# Patient Record
Sex: Female | Born: 2000 | Race: Black or African American | Hispanic: No | Marital: Single | Smoking: Never smoker
Health system: Southern US, Community
[De-identification: ages and names within clinical notes are randomized; demographics above are authoritative.]

## PROBLEM LIST (undated history)

## (undated) DIAGNOSIS — G35 Multiple sclerosis: Secondary | ICD-10-CM

## (undated) DIAGNOSIS — F419 Anxiety disorder, unspecified: Secondary | ICD-10-CM

## (undated) HISTORY — DX: Multiple sclerosis: G35

## (undated) HISTORY — DX: Anxiety disorder, unspecified: F41.9

---

## 2020-12-30 ENCOUNTER — Other Ambulatory Visit (HOSPITAL_COMMUNITY)
Admission: EM | Admit: 2020-12-30 | Discharge: 2021-01-02 | Disposition: A | Discharge status: Home / Self Care | Payer: BC Managed Care – PPO | Attending: Nurse Practitioner | Admitting: Nurse Practitioner

## 2020-12-30 ENCOUNTER — Other Ambulatory Visit: Payer: Self-pay

## 2020-12-30 DIAGNOSIS — Z79899 Other long term (current) drug therapy: Secondary | ICD-10-CM | POA: Diagnosis not present

## 2020-12-30 DIAGNOSIS — F4310 Post-traumatic stress disorder, unspecified: Secondary | ICD-10-CM | POA: Diagnosis not present

## 2020-12-30 DIAGNOSIS — F411 Generalized anxiety disorder: Secondary | ICD-10-CM | POA: Diagnosis not present

## 2020-12-30 DIAGNOSIS — R45851 Suicidal ideations: Secondary | ICD-10-CM | POA: Insufficient documentation

## 2020-12-30 DIAGNOSIS — F332 Major depressive disorder, recurrent severe without psychotic features: Secondary | ICD-10-CM | POA: Diagnosis present

## 2020-12-30 DIAGNOSIS — Z20822 Contact with and (suspected) exposure to covid-19: Secondary | ICD-10-CM | POA: Insufficient documentation

## 2020-12-30 LAB — CBC WITH DIFFERENTIAL/PLATELET
Abs Immature Granulocytes: 0.01 10*3/uL (ref 0.00–0.07)
Basophils Absolute: 0 10*3/uL (ref 0.0–0.1)
Basophils Relative: 1 %
Eosinophils Absolute: 0.4 10*3/uL (ref 0.0–0.5)
Eosinophils Relative: 7 %
HCT: 33.8 % — ABNORMAL LOW (ref 36.0–46.0)
Hemoglobin: 11 g/dL — ABNORMAL LOW (ref 12.0–15.0)
Immature Granulocytes: 0 %
Lymphocytes Relative: 37 %
Lymphs Abs: 2.1 10*3/uL (ref 0.7–4.0)
MCH: 29.1 pg (ref 26.0–34.0)
MCHC: 32.5 g/dL (ref 30.0–36.0)
MCV: 89.4 fL (ref 80.0–100.0)
Monocytes Absolute: 0.5 10*3/uL (ref 0.1–1.0)
Monocytes Relative: 9 %
Neutro Abs: 2.8 10*3/uL (ref 1.7–7.7)
Neutrophils Relative %: 46 %
Platelets: 281 10*3/uL (ref 150–400)
RBC: 3.78 MIL/uL — ABNORMAL LOW (ref 3.87–5.11)
RDW: 12.6 % (ref 11.5–15.5)
WBC: 5.9 10*3/uL (ref 4.0–10.5)
nRBC: 0 % (ref 0.0–0.2)

## 2020-12-30 LAB — LIPID PANEL
Cholesterol: 165 mg/dL (ref 0–200)
HDL: 61 mg/dL (ref >40)
LDL Cholesterol: 96 mg/dL (ref 0–99)
Total CHOL/HDL Ratio: 2.7 RATIO
Triglycerides: 38 mg/dL (ref <150)
VLDL: 8 mg/dL (ref 0–40)

## 2020-12-30 LAB — COMPREHENSIVE METABOLIC PANEL
ALT: 13 U/L (ref 0–44)
AST: 18 U/L (ref 15–41)
Albumin: 4.1 g/dL (ref 3.5–5.0)
Alkaline Phosphatase: 56 U/L (ref 38–126)
Anion gap: 7 (ref 5–15)
BUN: 7 mg/dL (ref 6–20)
CO2: 26 mmol/L (ref 22–32)
Calcium: 9.5 mg/dL (ref 8.9–10.3)
Chloride: 104 mmol/L (ref 98–111)
Creatinine, Ser: 0.72 mg/dL (ref 0.44–1.00)
GFR, Estimated: >60 mL/min (ref >60)
Glucose, Bld: 83 mg/dL (ref 70–99)
Potassium: 3.7 mmol/L (ref 3.5–5.1)
Sodium: 137 mmol/L (ref 135–145)
Total Bilirubin: 0.5 mg/dL (ref 0.3–1.2)
Total Protein: 7.2 g/dL (ref 6.5–8.1)

## 2020-12-30 LAB — POCT URINE DRUG SCREEN - MANUAL ENTRY (I-SCREEN)
POC Amphetamine UR: NOT DETECTED
POC Buprenorphine (BUP): NOT DETECTED
POC Cocaine UR: NOT DETECTED
POC Marijuana UR: POSITIVE — AB
POC Methadone UR: NOT DETECTED
POC Methamphetamine UR: NOT DETECTED
POC Morphine: NOT DETECTED
POC Oxazepam (BZO): NOT DETECTED
POC Oxycodone UR: NOT DETECTED
POC Secobarbital (BAR): NOT DETECTED

## 2020-12-30 LAB — POCT PREGNANCY, URINE: Preg Test, Ur: NEGATIVE

## 2020-12-30 LAB — RESP PANEL BY RT-PCR (FLU A&B, COVID) ARPGX2
Influenza A by PCR: NEGATIVE
Influenza B by PCR: NEGATIVE
SARS Coronavirus 2 by RT PCR: NEGATIVE

## 2020-12-30 LAB — POC SARS CORONAVIRUS 2 AG: SARSCOV2ONAVIRUS 2 AG: NEGATIVE

## 2020-12-30 LAB — HEMOGLOBIN A1C
Hgb A1c MFr Bld: 5.5 % (ref 4.8–5.6)
Mean Plasma Glucose: 111.15 mg/dL

## 2020-12-30 LAB — MAGNESIUM: Magnesium: 1.8 mg/dL (ref 1.7–2.4)

## 2020-12-30 LAB — TSH: TSH: 4.324 u[IU]/mL (ref 0.350–4.500)

## 2020-12-30 LAB — POC SARS CORONAVIRUS 2 AG -  ED: SARS Coronavirus 2 Ag: NEGATIVE

## 2020-12-30 MED ORDER — MAGNESIUM HYDROXIDE 400 MG/5ML PO SUSP: 30.0000 mL | Freq: Every day | ORAL | Status: DC | PRN

## 2020-12-30 MED ORDER — ALUM & MAG HYDROXIDE-SIMETH 200-200-20 MG/5ML PO SUSP: 30.0000 mL | ORAL | Status: DC | PRN

## 2020-12-30 MED ORDER — HYDROXYZINE HCL 25 MG PO TABS: 25.0000 mg | ORAL_TABLET | Freq: Three times a day (TID) | ORAL | Status: DC | PRN

## 2020-12-30 MED ORDER — SERTRALINE HCL 20 MG/ML PO CONC
25.0000 mg | Freq: Every day | ORAL | Status: DC
  Administered 2020-12-30 – 2021-01-02 (×4): 25 mg via ORAL
  Filled 2020-12-30 (×7): qty 1.25

## 2020-12-30 MED ORDER — TRAZODONE HCL 50 MG PO TABS: 50.0000 mg | ORAL_TABLET | Freq: Every evening | ORAL | Status: DC | PRN

## 2020-12-30 MED ORDER — ACETAMINOPHEN 325 MG PO TABS: 650.0000 mg | ORAL_TABLET | Freq: Four times a day (QID) | ORAL | Status: DC | PRN
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
## 2020-12-30 NOTE — ED Notes (Signed)
Given lunch
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
## 2020-12-30 NOTE — Progress Notes (Signed)
Patient visible on unit.  Approached staff with admission folder and asked if there were instructions to use the shower.  Patient already given hygiene supplies and towels.  Asked patient if she needed help with the shower and patient replied that she thought she could do it.  Patient calling mother.

## 2020-12-30 NOTE — Progress Notes (Signed)
Patient sitting in dayroom, playing cards, and socializing with peers. 
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
## 2020-12-31 ENCOUNTER — Encounter (HOSPITAL_COMMUNITY): Payer: Self-pay | Admitting: Nurse Practitioner

## 2020-12-31 NOTE — ED Notes (Signed)
Pt is in room sleeping, respirations are even/unlabored, environment check complete/secure, will continue to monitor patient for safety 

## 2020-12-31 NOTE — ED Notes (Signed)
Pt currently on telephone. No acute distress noted. Safety maintained.

## 2020-12-31 NOTE — ED Notes (Signed)
Patient A&Ox4. Denies intent to harm self/others when asked. Denies A/VH. Patient denies any physical complaints when asked. No acute distress noted. Routine safety checks conducted according to facility protocol. Encouraged patient to notify staff if thoughts of harm toward self or others arise. Patient verbalize understanding and agreement. Will continue to monitor for safety.    

## 2020-12-31 NOTE — Clinical Social Work Psych Note (Signed)
CSW Update   CSW met with patient to assess the patient's progress while being on the unit. According to [facility] she felt "okay" this morning. She reports she did not have any concerns regarding medications prescribed or the programming on the unit.   She continues to report that she will return to her student housing apartment near [employer]. She also ensured CSW that she plans to continue to follow up with the [facility] [address] and will establish services at [facility] to maintain medications.   Swayzie denied having any SI, HI or AVH at this time.   Aritzel shared that a friend was bringing her some extra clothing. CSW stated that would be fine.   Merceded did not have any additional needs or concerns at this time   CSW will continue to follow    Radonna Ricker, MSW, LCSW Clinical Education officer, museum ([place]) [facility]

## 2020-12-31 NOTE — ED Notes (Signed)
PT is in the dining room watching television. 

## 2020-12-31 NOTE — ED Notes (Signed)
Patient alert and verbal. Patient denies SI/HI at this time. Patient has been out in the milieu interacting with peers. Patient given support and encouragement and remains safe on the unit. Monitoring continues.

## 2020-12-31 NOTE — ED Notes (Signed)
Pt actively participating in group. No acute distress noted. Safety maintained. ?

## 2020-12-31 NOTE — Clinical Social Work Psych Note (Signed)
Anxiety  Diagnosis: Anxiety (Psychoeducational)  Date: 12/31/20  Type of Therapy/Therapeutic Modalities: Group Discussion, Psycho-Education  Participation Level: Active  Objective: The purpose of the group is to educate patients on the various components of anxiety and how it can influence and/or contribute to the inappropriate or disproportionate responses to perceived threats, leading to persistent and intrusive symptoms associated with different anxiety disorders.   Therapeutic Goals:  Patient will learn the foundations of anxiety, including its definition and the various types of anxiety disorders.  Patient will discuss with group members their personal experiences with anxiety and how it has impacted their lives  Patient will learn various distress tolerance and relaxation techniques that are effective in treating anxiety symptoms.  Patient will discuss with group members how they plan to address and/or maintain their anxiety symptoms moving forward.   Summary of Patient's Progress:   Elizabeth Tran was engaged and participated throughout the group session. Elizabeth Tran reports that she struggles with panic attacks and stress. She reports her anxiety is closely associated with past traumatic experiences she has had.

## 2020-12-31 NOTE — ED Notes (Signed)
Patient resting in bed with eyes closed. Respirations even and non labored. No distress noted and patient remains safe on unit.

## 2020-12-31 NOTE — ED Provider Notes (Signed)
Behavioral Health Progress Note  Date and Time: 12/31/2020 3:45 PM Name: Elizabeth Tran MRN:  [id number]  Subjective:  Patient seen and chart reviewed. She has been medication compliant with liquid zoloft 25 mg and has been attending groups. Patient observed speaking to LCSW after group; spoke with patient in conjunction with SW. Patient discusses at length relationship conflict with her father, past traumas and how these things have affected her. She describes herself as someone who has "walls" and holds things in, and indicates that this has affected her mood in a negative way and has led to anger. She has tolerated zoloft well. She denies SI/HI/AVH. She does report that she has thoughts of self [facility], although denies that these thoughts have suicidal intent; states that she has self harm thoughts because "I feel like I deserve it".   Diagnosis:  Final diagnoses:  Severe episode of recurrent major depressive disorder, without psychotic features (HCC)  PTSD (post-traumatic stress disorder)  GAD (generalized anxiety disorder)    Total Time spent with patient: 20 minutes  Past Psychiatric History: see H&P Past Medical History: No past medical history on file.  Family History: No family history on file. Family Psychiatric  History: see H&P Social History:  Social History   Substance and Sexual Activity  Alcohol Use None     Social History   Substance and Sexual Activity  Drug Use Not on file    Social History   Socioeconomic History   Marital status: Single    Spouse name: Not on file   Number of children: Not on file   Years of education: Not on file   Highest education level: Not on file  Occupational History   Not on file  Tobacco Use   Smoking status: Never    Passive exposure: Never   Smokeless tobacco: Never  Substance and Sexual Activity   Alcohol use: Not on file   Drug use: Not on file   Sexual activity: Not on file  Other Topics  Concern   Not on file  Social History Narrative   Not on file   Social Determinants of Health   Financial Resource Strain: Not on file  Food Insecurity: Not on file  Transportation Needs: Not on file  Physical Activity: Not on file  Stress: Not on file  Social Connections: Not on file   SDOH:  SDOH Screenings   Alcohol Screen: Not on file  Depression (PHQ2-9): Not on file  Financial Resource Strain: Not on file  Food Insecurity: Not on file  Housing: Not on file  Physical Activity: Not on file  Social Connections: Not on file  Stress: Not on file  Tobacco Use: Low Risk    Smoking Tobacco Use: Never   Smokeless Tobacco Use: Never  Transportation Needs: Not on file   Additional Social History:    Pain Medications: See MRA Prescriptions: See MRA Over the Counter: See MRa History of alcohol / drug use?: Yes Longest period of sobriety (when/how long): Pt reports that she stop taking edible marijuana on December 09, 2020 Negative Consequences of Use: Work / Programmer, [employer] Withdrawal Symptoms:  (UTA) Name of Substance 1: Marijuana (edible) 1 - Age of First Use: UTA 1 - Amount (size/oz): UTA 1 - Frequency: occassiionally 1 - Duration: ongoing 1 - Last Use / Amount: 11/29/2020 1 - Method of Aquiring: UTA 1- Route of Use: eat  Sleep: Fair  Appetite:  Fair  Current Medications:  Current Facility-Administered Medications  Medication Dose Route Frequency Provider Last Rate Last Admin   acetaminophen (TYLENOL) tablet 650 mg  650 mg Oral Q6H PRN Bobbitt, Shalon E, NP       alum & mag hydroxide-simeth (MAALOX/MYLANTA) 200-200-20 MG/5ML suspension 30 mL  30 mL Oral Q4H PRN Bobbitt, Shalon E, NP       hydrOXYzine (ATARAX/VISTARIL) tablet 25 mg  25 mg Oral TID PRN Bobbitt, Shalon E, NP       magnesium hydroxide (MILK OF MAGNESIA) suspension 30 mL  30 mL Oral Daily PRN Bobbitt, Shalon E, NP       sertraline (ZOLOFT) 20 MG/ML concentrated solution 25 mg  25 mg Oral  Daily Estella Husk, MD   25 mg at 12/31/20 1253   traZODone (DESYREL) tablet 50 mg  50 mg Oral QHS PRN Bobbitt, Shalon E, NP       Current Outpatient Medications  Medication Sig Dispense Refill   famotidine (PEPCID) 20 MG tablet Take 20 mg by mouth daily as needed for heartburn or indigestion.      Labs  Lab Results:  Admission on 12/30/2020  Component Date Value Ref Range Status   SARS Coronavirus 2 by RT PCR 12/30/2020 NEGATIVE  NEGATIVE Final   Comment: (NOTE) SARS-CoV-2 target nucleic acids are NOT DETECTED.  The SARS-CoV-2 RNA is generally detectable in upper respiratory specimens during the acute phase of infection. The lowest concentration of SARS-CoV-2 viral copies this assay can detect is 138 copies/mL. A negative result does not preclude SARS-Cov-2 infection and should not be used as the sole basis for treatment or other patient management decisions. A negative result may occur with  improper specimen collection/handling, submission of specimen other than nasopharyngeal swab, presence of viral mutation(s) within the areas targeted by this assay, and inadequate number of viral copies(<138 copies/mL). A negative result must be combined with clinical observations, patient history, and epidemiological information. The expected result is Negative.  Fact Sheet for Patients:  BloggerCourse.com  Fact Sheet for Healthcare Providers:  SeriousBroker.it  This test is no                          t yet approved or cleared by the [place] FDA and  has been authorized for detection and/or diagnosis of SARS-CoV-2 by FDA under an Emergency Use Authorization (EUA). This EUA will remain  in effect (meaning this test can be used) for the duration of the COVID-19 declaration under Section 564(b)(1) of the Act, 21 U.S.C.section 360bbb-3(b)(1), unless the authorization is terminated  or revoked sooner.       Influenza A by  PCR 12/30/2020 NEGATIVE  NEGATIVE Final   Influenza B by PCR 12/30/2020 NEGATIVE  NEGATIVE Final   Comment: (NOTE) The Xpert Xpress SARS-CoV-2/FLU/RSV plus assay is intended as an aid in the diagnosis of influenza from Nasopharyngeal swab specimens and should not be used as a sole basis for treatment. Nasal washings and aspirates are unacceptable for Xpert Xpress SARS-CoV-2/FLU/RSV testing.  Fact Sheet for Patients: BloggerCourse.com  Fact Sheet for Healthcare Providers: SeriousBroker.it  This test is not yet approved or cleared by the [place] FDA and has been authorized for detection and/or diagnosis of SARS-CoV-2 by FDA under an Emergency Use Authorization (EUA). This EUA will remain in effect (meaning this test can be used) for the duration of the COVID-19 declaration under Section 564(b)(1) of the Act,  21 U.S.C. section 360bbb-3(b)(1), unless the authorization is terminated or revoked.  Performed at [facility], [address], [place], [place] [address]    WBC 12/30/2020 5.9  4.0 - 10.5 K/uL Final   RBC 12/30/2020 3.78 (A) 3.87 - 5.11 MIL/uL Final   Hemoglobin 12/30/2020 11.0 (A) 12.0 - 15.0 g/dL Final   HCT 91/47/8295 33.8 (A) 36.0 - 46.0 % Final   MCV 12/30/2020 89.4  80.0 - 100.0 fL Final   MCH 12/30/2020 29.1  26.0 - 34.0 pg Final   MCHC 12/30/2020 32.5  30.0 - 36.0 g/dL Final   RDW 62/13/0865 12.6  11.5 - 15.5 % Final   Platelets 12/30/2020 281  150 - 400 K/uL Final   nRBC 12/30/2020 0.0  0.0 - 0.2 % Final   Neutrophils Relative % 12/30/2020 46  % Final   Neutro Abs 12/30/2020 2.8  1.7 - 7.7 K/uL Final   Lymphocytes Relative 12/30/2020 37  % Final   Lymphs Abs 12/30/2020 2.1  0.7 - 4.0 K/uL Final   Monocytes Relative 12/30/2020 9  % Final   Monocytes Absolute 12/30/2020 0.5  0.1 - 1.0 K/uL Final   Eosinophils Relative 12/30/2020 7  % Final   Eosinophils Absolute 12/30/2020 0.4  0.0 - 0.5 K/uL Final    Basophils Relative 12/30/2020 1  % Final   Basophils Absolute 12/30/2020 0.0  0.0 - 0.1 K/uL Final   Immature Granulocytes 12/30/2020 0  % Final   Abs Immature Granulocytes 12/30/2020 0.01  0.00 - 0.07 K/uL Final   Performed at [facility], [address] [place], [place] [address]   Sodium 12/30/2020 137  135 - 145 mmol/L Final   Potassium 12/30/2020 3.7  3.5 - 5.1 mmol/L Final   Chloride 12/30/2020 104  98 - 111 mmol/L Final   CO2 12/30/2020 26  22 - 32 mmol/L Final   Glucose, Bld 12/30/2020 83  70 - 99 mg/dL Final   Glucose reference range applies only to samples taken after fasting for at least 8 hours.   BUN 12/30/2020 7  6 - 20 mg/dL Final   Creatinine, Ser 12/30/2020 0.72  0.44 - 1.00 mg/dL Final   Calcium 62/95/2841 9.5  8.9 - 10.3 mg/dL Final   Total Protein 32/44/0102 7.2  6.5 - 8.1 g/dL Final   Albumin 72/53/6644 4.1  3.5 - 5.0 g/dL Final   AST 03/47/4259 18  15 - 41 U/L Final   ALT 12/30/2020 13  0 - 44 U/L Final   Alkaline Phosphatase 12/30/2020 56  38 - 126 U/L Final   Total Bilirubin 12/30/2020 0.5  0.3 - 1.2 mg/dL Final   GFR, Estimated 12/30/2020 >60  >60 mL/min Final   Comment: (NOTE) Calculated using the CKD-EPI Creatinine Equation (2021)    Anion gap 12/30/2020 7  5 - 15 Final   Performed at [facility], [address], [place], [place] [address]   Hgb A1c MFr Bld 12/30/2020 5.5  4.8 - 5.6 % Final   Comment: (NOTE) Pre diabetes:          5.7%-6.4%  Diabetes:              >6.4%  Glycemic control for   <7.0% adults with diabetes    Mean Plasma Glucose 12/30/2020 111.15  mg/dL Final   Performed at [facility], [address], [place], [place] [address]   Magnesium 12/30/2020 1.8  1.7 - 2.4 mg/dL Final   Performed  at [facility], [address] [place], [place] [address]   Cholesterol 12/30/2020 165  0 - 200 mg/dL Final   Triglycerides 41/74/0814 38  <150 mg/dL Final   HDL 48/18/5631 61  >40 mg/dL Final   Total CHOL/HDL Ratio  12/30/2020 2.7  RATIO Final   VLDL 12/30/2020 8  0 - 40 mg/dL Final   LDL Cholesterol 12/30/2020 96  0 - 99 mg/dL Final   Comment:        Total Cholesterol/HDL:CHD Risk Coronary Heart Disease Risk Table                     Men   Women  1/2 Average Risk   3.4   3.3  Average Risk       5.0   4.4  2 X Average Risk   9.6   7.1  3 X Average Risk  23.4   11.0        Use the calculated Patient Ratio above and the CHD Risk Table to determine the patient's CHD Risk.        ATP III CLASSIFICATION (LDL):  <100     mg/dL   Optimal  497-026  mg/dL   Near or Above                    Optimal  130-159  mg/dL   Borderline  378-588  mg/dL   High  >502     mg/dL   Very High Performed at [facility], [address] [place], [place] [address]    TSH 12/30/2020 4.324  0.350 - 4.500 uIU/mL Final   Comment: Performed by a 3rd Generation assay with a functional sensitivity of <=0.01 uIU/mL. Performed at [facility], [address], [place], [place] [address]    POC Amphetamine UR 12/30/2020 None Detected  NONE DETECTED (Cut Off Level 1000 ng/mL) Final   POC Secobarbital (BAR) 12/30/2020 None Detected  NONE DETECTED (Cut Off Level 300 ng/mL) Final   POC Buprenorphine (BUP) 12/30/2020 None Detected  NONE DETECTED (Cut Off Level 10 ng/mL) Final   POC Oxazepam (BZO) 12/30/2020 None Detected  NONE DETECTED (Cut Off Level 300 ng/mL) Final   POC Cocaine UR 12/30/2020 None Detected  NONE DETECTED (Cut Off Level 300 ng/mL) Final   POC Methamphetamine UR 12/30/2020 None Detected  NONE DETECTED (Cut Off Level 1000 ng/mL) Final   POC Morphine 12/30/2020 None Detected  NONE DETECTED (Cut Off Level 300 ng/mL) Final   POC Oxycodone UR 12/30/2020 None Detected  NONE DETECTED (Cut Off Level 100 ng/mL) Final   POC Methadone UR 12/30/2020 None Detected  NONE DETECTED (Cut Off Level 300 ng/mL) Final   POC Marijuana UR 12/30/2020 Positive (A) NONE DETECTED (Cut Off Level 50 ng/mL) Final   SARS Coronavirus 2  Ag 12/30/2020 Negative  Negative Preliminary   Preg Test, Ur 12/30/2020 NEGATIVE  NEGATIVE Final   Comment:        THE SENSITIVITY OF THIS METHODOLOGY IS >24 mIU/mL    SARSCOV2ONAVIRUS 2 AG 12/30/2020 NEGATIVE  NEGATIVE Final   Comment: (NOTE) SARS-CoV-2 antigen NOT DETECTED.   Negative results are presumptive.  Negative results do not preclude SARS-CoV-2 infection and should not be used as the sole basis for treatment or other patient management decisions, including infection  control decisions, particularly in the presence of clinical signs and  symptoms consistent with COVID-19, or in those who have been in contact with the  virus.  Negative results must be combined with clinical observations, patient history, and epidemiological information. The expected result is Negative.  Fact Sheet for Patients: https://www.jennings-kim.com/  Fact Sheet for Healthcare Providers: https://alexander-rogers.biz/  This test is not yet approved or cleared by the [place] FDA and  has been authorized for detection and/or diagnosis of SARS-CoV-2 by FDA under an Emergency Use Authorization (EUA).  This EUA will remain in effect (meaning this test can be used) for the duration of  the COV                          ID-19 declaration under Section 564(b)(1) of the Act, 21 U.S.C. section 360bbb-3(b)(1), unless the authorization is terminated or revoked sooner.      Blood Alcohol level:  No results found for: [facility]  Metabolic Disorder Labs: Lab Results  Component Value Date   HGBA1C 5.5 12/30/2020   MPG 111.15 12/30/2020   No results found for: PROLACTIN Lab Results  Component Value Date   CHOL 165 12/30/2020   TRIG 38 12/30/2020   HDL 61 12/30/2020   CHOLHDL 2.7 12/30/2020   VLDL 8 12/30/2020   LDLCALC 96 12/30/2020    Therapeutic Lab Levels: No results found for: LITHIUM No results found for: VALPROATE No components found for:  CBMZ  Physical Findings    Flowsheet Row ED from 12/30/2020 in [facility]  C-SSRS RISK CATEGORY High Risk        Musculoskeletal  Strength & Muscle Tone: within normal limits Gait & Station: normal Patient leans: N/A  Psychiatric Specialty Exam  Presentation  General Appearance: Appropriate for Environment; Casual  Eye Contact:Fair  Speech:Clear and Coherent; Normal Rate  Speech Volume:Normal  Handedness:Right   Mood and Affect  Mood:Angry; Anxious; Depressed  Affect:Depressed; Blunt   Thought Process  Thought Processes:Coherent; Goal Directed; Linear  Descriptions of Associations:Intact  Orientation:Full (Time, Place and Person)  Thought Content:WDL; Logical  Diagnosis of Schizophrenia or Schizoaffective disorder in past: No    Hallucinations:Hallucinations: None Description of Visual Hallucinations: shadow. see above  Ideas of Reference:None  Suicidal Thoughts:Suicidal Thoughts: No (reports thoughts of self harm although denies SI) SI Active Intent and/or Plan: With Intent; With Plan  Homicidal Thoughts:Homicidal Thoughts: No   Sensorium  Memory:Immediate Good; Recent Good; Remote Good  Judgment:Fair  Insight:Fair   Executive Functions  Concentration:Good  Attention Span:Good  Recall:Good  Fund of Knowledge:Good  Language:Good   Psychomotor Activity  Psychomotor Activity:Psychomotor Activity: Normal   Assets  Assets:Communication Skills; Desire for Improvement; Financial Resources/Insurance; Housing; Leisure Time; Physical Health; Resilience; Social Support; Vocational/Educational   Sleep  Sleep:Sleep: Fair Number of Hours of Sleep: -1   Nutritional Assessment (For OBS and FBC admissions only) Has the patient had a weight loss or gain of 10 pounds or more in the last 3 months?: Yes Has the patient had a decrease in food intake/or appetite?: Yes Does the patient have dental problems?: No Does the patient have eating habits  or behaviors that may be indicators of an eating disorder including binging or inducing vomiting?: No Has the patient recently lost weight without trying?: Yes, 2-13 lbs. Has the patient been eating poorly because of a decreased appetite?: No Malnutrition Screening Tool Score: 1    Physical Exam  Physical Exam Constitutional:      Appearance: Normal appearance. She is normal weight.  HENT:     Head: Normocephalic and atraumatic.  Eyes:  Comments: Dry skin bilaterally above eye lids  Pulmonary:     Effort: Pulmonary effort is normal.  Neurological:     Mental Status: She is alert and oriented to person, place, and time.   Review of Systems  Constitutional:  Negative for chills and fever.  HENT:  Negative for hearing loss.   Eyes:  Negative for discharge and redness.  Respiratory:  Negative for cough.   Cardiovascular:  Negative for chest pain.  Gastrointestinal:  Negative for abdominal pain.  Musculoskeletal:  Negative for myalgias.  Neurological:  Negative for headaches.  Psychiatric/Behavioral:  Positive for depression. Negative for suicidal ideas.   Blood pressure 113/89, pulse 60, temperature 98.4 F (36.9 C), temperature source Oral, resp. rate 20, SpO2 100 %. There is no height or weight on file to calculate BMI.  Treatment Plan Summary: 20 yo female who presented to the [facility] with SI with a plan to cut her wrist via mobile crisis unit. Patient was agreeable to admission to [facility] for further treatment.  Labs reviewed- CBC, lipids, a1c, TSH, unremarkable. UDS+THC, pregnancy test neg, CBC with mildly decreased hemoglobin at 11, covid negative. Patient agreeable to start zoloft 25 mg for mood and has tolerated it well. Patientc continues to report low mood and thoughts of self harm; although denies SI, plan or intent. She is  appropriate for continued treatment at the [facility] at this time.     MDD -continue zoloft 25 mg daily liquid forulation  Dispo: ongoing. SW assisting with  dispo  Estella Husk, MD 12/31/2020 3:45 PM

## 2020-12-31 NOTE — ED Notes (Signed)
Patient is resting in bed with eyes closed. Respirations are even and non labored. Monitoring continues and patient remains safe on unit.

## 2021-01-01 LAB — PROLACTIN: Prolactin: 17.7 ng/mL (ref 4.8–23.3)

## 2021-01-01 NOTE — ED Provider Notes (Signed)
Behavioral Health Progress Note  Date and Time: 01/01/2021 3:53 PM Name: Elizabeth Tran MRN:  [id number]  Subjective:   Original note by student Doctor Rosana Hoes; edited by me as appropriate for completeness and accuracy.    Patient seen and chart reviewed. Patient interviewed in conjuction with medical students. She reports improving mood with the main stressors being her father, ex-stepfather, and difficulties with school. She notes that she has been reflecting on yesterday's conversation with myself and LCSW and working on setting boundaries with the main stressors that she has been experiencing. She discussed that she understands that setting boundaries will be difficult, but notes it is something she wants to do as she "wants to feel better". She noted deleting her playlist of sad music, and blocking some contacts on her phone that she feels are not reciprocating the effort that she puts into the relationship. She notes that the group sessions have been helpful. She denies SI today, but notes HI towards ex-stepfather, whom she is currently not in contact with. There is no intent or plan associated with vague HI; she does not know how to get in contact with him-there is no credible threat.  She has been tolerating zoloft well, noting mild headaches.        Diagnosis:  Final diagnoses:  Severe episode of recurrent major depressive disorder, without psychotic features (HCC)  PTSD (post-traumatic stress disorder)  GAD (generalized anxiety disorder)    Total Time spent with patient: 20 minutes  Past Psychiatric History: see H&P Past Medical History: No past medical history on file.  Family History: No family history on file. Family Psychiatric  History: see H&P Social History:  Social History   Substance and Sexual Activity  Alcohol Use None     Social History   Substance and Sexual Activity  Drug Use Not on file    Social History   Socioeconomic History   Marital status: Single     Spouse name: Not on file   Number of children: Not on file   Years of education: Not on file   Highest education level: Not on file  Occupational History   Not on file  Tobacco Use   Smoking status: Never    Passive exposure: Never   Smokeless tobacco: Never  Substance and Sexual Activity   Alcohol use: Not on file   Drug use: Not on file   Sexual activity: Not on file  Other Topics Concern   Not on file  Social History Narrative   Not on file   Social Determinants of Health   Financial Resource Strain: Not on file  Food Insecurity: Not on file  Transportation Needs: Not on file  Physical Activity: Not on file  Stress: Not on file  Social Connections: Not on file   SDOH:  SDOH Screenings   Alcohol Screen: Not on file  Depression (PHQ2-9): Not on file  Financial Resource Strain: Not on file  Food Insecurity: Not on file  Housing: Not on file  Physical Activity: Not on file  Social Connections: Not on file  Stress: Not on file  Tobacco Use: Low Risk    Smoking Tobacco Use: Never   Smokeless Tobacco Use: Never  Transportation Needs: Not on file   Additional Social History:    Pain Medications: See MRA Prescriptions: See MRA Over the Counter: See MRa History of alcohol / drug use?: Yes Longest period of sobriety (when/how long): Pt reports that she stop taking edible marijuana on December 09, 2020  Negative Consequences of Use: Work / School Withdrawal Symptoms:  (UTA) Name of Substance 1: Marijuana (edible) 1 - Age of First Use: UTA 1 - Amount (size/oz): UTA 1 - Frequency: occassiionally 1 - Duration: ongoing 1 - Last Use / Amount: 11/29/2020 1 - Method of Aquiring: UTA 1- Route of Use: eat                  Sleep: Fair  Appetite:  Fair  Current Medications:  Current Facility-Administered Medications  Medication Dose Route Frequency Provider Last Rate Last Admin   acetaminophen (TYLENOL) tablet 650 mg  650 mg Oral Q6H PRN Bobbitt, Shalon E, NP        alum & mag hydroxide-simeth (MAALOX/MYLANTA) 200-200-20 MG/5ML suspension 30 mL  30 mL Oral Q4H PRN Bobbitt, Shalon E, NP       hydrOXYzine (ATARAX/VISTARIL) tablet 25 mg  25 mg Oral TID PRN Bobbitt, Shalon E, NP       magnesium hydroxide (MILK OF MAGNESIA) suspension 30 mL  30 mL Oral Daily PRN Bobbitt, Shalon E, NP       sertraline (ZOLOFT) 20 MG/ML concentrated solution 25 mg  25 mg Oral Daily Estella Husk, MD   25 mg at 01/01/21 3825   traZODone (DESYREL) tablet 50 mg  50 mg Oral QHS PRN Bobbitt, Shalon E, NP       Current Outpatient Medications  Medication Sig Dispense Refill   famotidine (PEPCID) 20 MG tablet Take 20 mg by mouth daily as needed for heartburn or indigestion.      Labs  Lab Results:  Admission on 12/30/2020  Component Date Value Ref Range Status   SARS Coronavirus 2 by RT PCR 12/30/2020 NEGATIVE  NEGATIVE Final   Comment: (NOTE) SARS-CoV-2 target nucleic acids are NOT DETECTED.  The SARS-CoV-2 RNA is generally detectable in upper respiratory specimens during the acute phase of infection. The lowest concentration of SARS-CoV-2 viral copies this assay can detect is 138 copies/mL. A negative result does not preclude SARS-Cov-2 infection and should not be used as the sole basis for treatment or other patient management decisions. A negative result may occur with  improper specimen collection/handling, submission of specimen other than nasopharyngeal swab, presence of viral mutation(s) within the areas targeted by this assay, and inadequate number of viral copies(<138 copies/mL). A negative result must be combined with clinical observations, patient history, and epidemiological information. The expected result is Negative.  Fact Sheet for Patients:  BloggerCourse.com  Fact Sheet for Healthcare Providers:  SeriousBroker.it  This test is no                          t yet approved or cleared by the  [place] FDA and  has been authorized for detection and/or diagnosis of SARS-CoV-2 by FDA under an Emergency Use Authorization (EUA). This EUA will remain  in effect (meaning this test can be used) for the duration of the COVID-19 declaration under Section 564(b)(1) of the Act, 21 U.S.C.section 360bbb-3(b)(1), unless the authorization is terminated  or revoked sooner.       Influenza A by PCR 12/30/2020 NEGATIVE  NEGATIVE Final   Influenza B by PCR 12/30/2020 NEGATIVE  NEGATIVE Final   Comment: (NOTE) The Xpert Xpress SARS-CoV-2/FLU/RSV plus assay is intended as an aid in the diagnosis of influenza from Nasopharyngeal swab specimens and should not be used as a sole basis for treatment. Nasal washings and aspirates are unacceptable for Xpert Xpress  SARS-CoV-2/FLU/RSV testing.  Fact Sheet for Patients: BloggerCourse.com  Fact Sheet for Healthcare Providers: SeriousBroker.it  This test is not yet approved or cleared by the [place] FDA and has been authorized for detection and/or diagnosis of SARS-CoV-2 by FDA under an Emergency Use Authorization (EUA). This EUA will remain in effect (meaning this test can be used) for the duration of the COVID-19 declaration under Section 564(b)(1) of the Act, 21 U.S.C. section 360bbb-3(b)(1), unless the authorization is terminated or revoked.  Performed at [facility], [address], [place], [place] [address]    WBC 12/30/2020 5.9  4.0 - 10.5 K/uL Final   RBC 12/30/2020 3.78 (A) 3.87 - 5.11 MIL/uL Final   Hemoglobin 12/30/2020 11.0 (A) 12.0 - 15.0 g/dL Final   HCT 86/57/8469 33.8 (A) 36.0 - 46.0 % Final   MCV 12/30/2020 89.4  80.0 - 100.0 fL Final   MCH 12/30/2020 29.1  26.0 - 34.0 pg Final   MCHC 12/30/2020 32.5  30.0 - 36.0 g/dL Final   RDW 62/95/2841 12.6  11.5 - 15.5 % Final   Platelets 12/30/2020 281  150 - 400 K/uL Final   nRBC 12/30/2020 0.0  0.0 - 0.2 % Final    Neutrophils Relative % 12/30/2020 46  % Final   Neutro Abs 12/30/2020 2.8  1.7 - 7.7 K/uL Final   Lymphocytes Relative 12/30/2020 37  % Final   Lymphs Abs 12/30/2020 2.1  0.7 - 4.0 K/uL Final   Monocytes Relative 12/30/2020 9  % Final   Monocytes Absolute 12/30/2020 0.5  0.1 - 1.0 K/uL Final   Eosinophils Relative 12/30/2020 7  % Final   Eosinophils Absolute 12/30/2020 0.4  0.0 - 0.5 K/uL Final   Basophils Relative 12/30/2020 1  % Final   Basophils Absolute 12/30/2020 0.0  0.0 - 0.1 K/uL Final   Immature Granulocytes 12/30/2020 0  % Final   Abs Immature Granulocytes 12/30/2020 0.01  0.00 - 0.07 K/uL Final   Performed at [facility], [address], [place], [place] [address]   Sodium 12/30/2020 137  135 - 145 mmol/L Final   Potassium 12/30/2020 3.7  3.5 - 5.1 mmol/L Final   Chloride 12/30/2020 104  98 - 111 mmol/L Final   CO2 12/30/2020 26  22 - 32 mmol/L Final   Glucose, Bld 12/30/2020 83  70 - 99 mg/dL Final   Glucose reference range applies only to samples taken after fasting for at least 8 hours.   BUN 12/30/2020 7  6 - 20 mg/dL Final   Creatinine, Ser 12/30/2020 0.72  0.44 - 1.00 mg/dL Final   Calcium 02/21/2535 9.5  8.9 - 10.3 mg/dL Final   Total Protein 64/40/3474 7.2  6.5 - 8.1 g/dL Final   Albumin 25/95/6387 4.1  3.5 - 5.0 g/dL Final   AST 56/43/3295 18  15 - 41 U/L Final   ALT 12/30/2020 13  0 - 44 U/L Final   Alkaline Phosphatase 12/30/2020 56  38 - 126 U/L Final   Total Bilirubin 12/30/2020 0.5  0.3 - 1.2 mg/dL Final   GFR, Estimated 12/30/2020 >60  >60 mL/min Final   Comment: (NOTE) Calculated using the CKD-EPI Creatinine Equation (2021)    Anion gap 12/30/2020 7  5 - 15 Final   Performed at [facility], [address] [place], [place] [address]   Hgb A1c MFr Bld 12/30/2020 5.5  4.8 - 5.6 % Final   Comment: (NOTE) Pre diabetes:  5.7%-6.4%  Diabetes:              >6.4%  Glycemic control for   <7.0% adults with diabetes    Mean Plasma  Glucose 12/30/2020 111.15  mg/dL Final   Performed at [facility], [address] [place], [place] [address]   Magnesium 12/30/2020 1.8  1.7 - 2.4 mg/dL Final   Performed at [facility], [address], [place], [place] [address]   Cholesterol 12/30/2020 165  0 - 200 mg/dL Final   Triglycerides 82/95/6213 38  <150 mg/dL Final   HDL 08/65/7846 61  >40 mg/dL Final   Total CHOL/HDL Ratio 12/30/2020 2.7  RATIO Final   VLDL 12/30/2020 8  0 - 40 mg/dL Final   LDL Cholesterol 12/30/2020 96  0 - 99 mg/dL Final   Comment:        Total Cholesterol/HDL:CHD Risk Coronary Heart Disease Risk Table                     Men   Women  1/2 Average Risk   3.4   3.3  Average Risk       5.0   4.4  2 X Average Risk   9.6   7.1  3 X Average Risk  23.4   11.0        Use the calculated Patient Ratio above and the CHD Risk Table to determine the patient's CHD Risk.        ATP III CLASSIFICATION (LDL):  <100     mg/dL   Optimal  962-952  mg/dL   Near or Above                    Optimal  130-159  mg/dL   Borderline  841-324  mg/dL   High  >401     mg/dL   Very High Performed at [facility], [address], [place], [place] [address]    TSH 12/30/2020 4.324  0.350 - 4.500 uIU/mL Final   Comment: Performed by a 3rd Generation assay with a functional sensitivity of <=0.01 uIU/mL. Performed at [facility], [address], [place], [place] [address]    POC Amphetamine UR 12/30/2020 None Detected  NONE DETECTED (Cut Off Level 1000 ng/mL) Final   POC Secobarbital (BAR) 12/30/2020 None Detected  NONE DETECTED (Cut Off Level 300 ng/mL) Final   POC Buprenorphine (BUP) 12/30/2020 None Detected  NONE DETECTED (Cut Off Level 10 ng/mL) Final   POC Oxazepam (BZO) 12/30/2020 None Detected  NONE DETECTED (Cut Off Level 300 ng/mL) Final   POC Cocaine UR 12/30/2020 None Detected  NONE DETECTED (Cut Off Level 300 ng/mL) Final   POC Methamphetamine UR 12/30/2020 None Detected  NONE DETECTED (Cut  Off Level 1000 ng/mL) Final   POC Morphine 12/30/2020 None Detected  NONE DETECTED (Cut Off Level 300 ng/mL) Final   POC Oxycodone UR 12/30/2020 None Detected  NONE DETECTED (Cut Off Level 100 ng/mL) Final   POC Methadone UR 12/30/2020 None Detected  NONE DETECTED (Cut Off Level 300 ng/mL) Final   POC Marijuana UR 12/30/2020 Positive (A) NONE DETECTED (Cut Off Level 50 ng/mL) Final   SARS Coronavirus 2 Ag 12/30/2020 Negative  Negative Preliminary   Preg Test, Ur 12/30/2020 NEGATIVE  NEGATIVE Final   Comment:        THE SENSITIVITY OF THIS METHODOLOGY IS >24 mIU/mL    SARSCOV2ONAVIRUS 2 AG 12/30/2020 NEGATIVE  NEGATIVE  Final   Comment: (NOTE) SARS-CoV-2 antigen NOT DETECTED.   Negative results are presumptive.  Negative results do not preclude SARS-CoV-2 infection and should not be used as the sole basis for treatment or other patient management decisions, including infection  control decisions, particularly in the presence of clinical signs and  symptoms consistent with COVID-19, or in those who have been in contact with the virus.  Negative results must be combined with clinical observations, patient history, and epidemiological information. The expected result is Negative.  Fact Sheet for Patients: https://www.jennings-kim.com/  Fact Sheet for Healthcare Providers: https://alexander-rogers.biz/  This test is not yet approved or cleared by the [place] FDA and  has been authorized for detection and/or diagnosis of SARS-CoV-2 by FDA under an Emergency Use Authorization (EUA).  This EUA will remain in effect (meaning this test can be used) for the duration of  the COV                          ID-19 declaration under Section 564(b)(1) of the Act, 21 U.S.C. section 360bbb-3(b)(1), unless the authorization is terminated or revoked sooner.      Blood Alcohol level:  No results found for: [facility]  Metabolic Disorder Labs: Lab Results  Component Value  Date   HGBA1C 5.5 12/30/2020   MPG 111.15 12/30/2020   No results found for: PROLACTIN Lab Results  Component Value Date   CHOL 165 12/30/2020   TRIG 38 12/30/2020   HDL 61 12/30/2020   CHOLHDL 2.7 12/30/2020   VLDL 8 12/30/2020   LDLCALC 96 12/30/2020    Therapeutic Lab Levels: No results found for: LITHIUM No results found for: VALPROATE No components found for:  CBMZ  Physical Findings   Flowsheet Row ED [facility] 12/30/2020 in [facility]  C-SSRS RISK CATEGORY High Risk        Musculoskeletal  Strength & Muscle Tone: within normal limits Gait & Station: normal Patient leans: N/A  Psychiatric Specialty Exam  Presentation  General Appearance: Casual; Appropriate for Environment  Eye Contact:Fleeting  Speech:Blocked; Clear and Coherent  Speech Volume:Normal  Handedness:Right   Mood and Affect  Mood:Dysphoric  Affect:Congruent; Other (comment) (generally blunted but appropriately reactive)   Thought Process  Thought Processes:Coherent; Goal Directed; Linear  Descriptions of Associations:Intact  Orientation:Full (Time, Place and Person)  Thought Content:WDL; Logical  Diagnosis of Schizophrenia or Schizoaffective disorder in past: No    Hallucinations:Hallucinations: None  Ideas of Reference:None  Suicidal Thoughts:Suicidal Thoughts: No  Homicidal Thoughts:Homicidal Thoughts: No   Sensorium  Memory:Immediate Good; Recent Good; Remote Good  Judgment:Fair  Insight:Fair   Executive Functions  Concentration:Good  Attention Span:Good  Recall:Good  Fund of Knowledge:Good  Language:Good   Psychomotor Activity  Psychomotor Activity:Psychomotor Activity: Normal   Assets  Assets:Communication Skills; Desire for Improvement; Financial Resources/Insurance; Housing; Leisure Time; Physical Health; Resilience; Vocational/Educational   Sleep  Sleep:Sleep: Fair   No data recorded   Physical Exam  Physical  Exam Constitutional:      Appearance: Normal appearance. She is normal weight.  HENT:     Head: Normocephalic and atraumatic.  Eyes:     Comments: Dry skin bilaterally above eye lids  Pulmonary:     Effort: Pulmonary effort is normal.  Neurological:     Mental Status: She is alert and oriented to person, place, and time.   Review of Systems  Constitutional:  Negative for chills and fever.  HENT:  Negative for hearing loss.  Eyes:  Negative for discharge and redness.  Respiratory:  Negative for cough.   Cardiovascular:  Negative for chest pain.  Gastrointestinal:  Negative for abdominal pain.  Musculoskeletal:  Negative for myalgias.  Neurological:  Positive for headaches.  Psychiatric/Behavioral:  Positive for depression. Negative for suicidal ideas.   Blood pressure 111/74, pulse 84, temperature 98 F (36.7 C), temperature source Oral, resp. rate 16, SpO2 100 %. There is no height or weight on file to calculate BMI.  Treatment Plan Summary: 20 yo female [facility] with a plan to cut her wrist via mobile crisis unit. [facility]  Labs reviewed- CBC, lipids, a1c, TSH, unremarkable. UDS+THC, pregnancy test neg, CBC with mildly decreased hemoglobin at 11, covid negative. Patient agreeable to start zoloft 25 mg for mood and has tolerated it well. Patient reports improvement in her mood overall and has found groups to be helpful She is  [facility]-anticipate discharge on Friday if she continues to progress.    MDD -continue zoloft 25 mg daily liquid forulation  Dispo: ongoing. SW assisting with dispo  Estella HuskKatherine S Jaz Mallick, MD 01/01/2021 3:53 PM

## 2021-01-01 NOTE — Medical Student Note (Signed)
[facility] Provider Student Note For educational purposes for Medical, PA and NP students only and not part of the legal medical record.  CSN: [id number] Arrival date & time: 12/30/20  0248    History   Chief Complaint Chief Complaint  Patient presents with   Depression   Subjective: Patient seen and chart reviewed. She reports improving mood with the main stressors being her father, ex-stepfather, and difficulties with school. She notes that she has been reflecting on yesterday's conversation with Dr. Bronwen Betters, and working on setting boundaries with the main stressors that she has been experiencing. She discussed that she understands that setting boundaries will be difficult, but notes it is something she wants to do as she "wants to feel better". She noted deleting her playlist of sad music, and blocking some contacts on her phone that she feels are not reciprocating the effort that she puts into the relationship. She notes that the group sessions have been helpful. She denies SI today, but notes HI towards ex-stepfather, whom she is currently not in contact with. She has been tolerating zoloft well, noting mild headaches.   Past Psychiatric History: see H&P  No past medical history on file.  Patient Active Problem List   Diagnosis Date Noted   MDD (major depressive disorder), recurrent severe, without psychosis (HCC) 12/30/2020    Home Medications    Prior to Admission medications   Medication Sig Start Date End Date Taking? Authorizing Provider  famotidine (PEPCID) 20 MG tablet Take 20 mg by mouth daily as needed for heartburn or indigestion.   Yes [provider]   Family History No family history on file.  Social History Social History   Tobacco Use   Smoking status: Never    Passive exposure: Never   Smokeless tobacco: Never    Allergies   Patient has no known allergies.  Review of Systems Review of Systems  Physical Exam Updated Vital  Signs BP 111/74 (BP Location: Left Arm)   Pulse 84   Temp 98 F (36.7 C) (Oral)   Resp 16   SpO2 100%   Physical Exam  Mental Status Exam: Appearance: dressed appropriately, fleeting eye contact Behavior/Cooperation/Attitude: cooperative Speech:  normal tone and voice, normal rate  Mood: "want to get better"  Affect: blunted yet reactive Thought Process: appropriately linear  Thought Content: denies SI, reports HI with no plan Orientation: oriented to time and place Attention/Memory: good Cognition: grossly intact Insight: good Judgment: good Impulse Control: good Reliability: good   ED Treatments / Results  Labs (all labs ordered are listed, but only abnormal results are displayed) Labs Reviewed  CBC WITH DIFFERENTIAL/PLATELET - Abnormal; Notable for the following components:      Result Value   RBC 3.78 (*)    Hemoglobin 11.0 (*)    HCT 33.8 (*)    All other components within normal limits  POCT URINE DRUG SCREEN - MANUAL ENTRY (I-CUP) - Abnormal; Notable for the following components:   POC Marijuana UR Positive (*)    All other components within normal limits  POC SARS CORONAVIRUS 2 AG -  ED - Normal  RESP PANEL BY RT-PCR (FLU A&B, COVID) [id number]  COMPREHENSIVE METABOLIC PANEL  HEMOGLOBIN A1C  MAGNESIUM  LIPID PANEL  TSH  PROLACTIN  PREGNANCY, URINE  POCT PREGNANCY, URINE  POC SARS CORONAVIRUS 2 AG    Medications Ordered in ED Medications  acetaminophen (TYLENOL) tablet 650 mg (has no administration in time range)  alum & mag  hydroxide-simeth (MAALOX/MYLANTA) 200-200-20 MG/5ML suspension 30 mL (has no administration in time range)  magnesium hydroxide (MILK OF MAGNESIA) suspension 30 mL (has no administration in time range)  hydrOXYzine (ATARAX/VISTARIL) tablet 25 mg (has no administration in time range)  traZODone (DESYREL) tablet 50 mg (has no administration in time range)  sertraline (ZOLOFT) 20 MG/ML concentrated solution 25 mg (25 mg Oral Given  12/31/20 1253)     Initial Impression / Assessment and Plan / ED Course   She is appropriate for continued treatment at the [facility] at this time. Plan for tentative discharge on Friday 9/9.     Final Clinical Impressions(s) / ED Diagnoses   Final diagnoses:  Severe episode of recurrent major depressive disorder, without psychotic features (HCC)  PTSD (post-traumatic stress disorder)  GAD (generalized anxiety disorder)    New Prescriptions New Prescriptions   No medications on file    Pilar Grammes, MS3

## 2021-01-01 NOTE — Progress Notes (Signed)
Patient on the telephone, this morning talking to family. No objective signs of distress at this time. Nursing staff will continue to monitor.

## 2021-01-01 NOTE — ED Notes (Signed)
Pt is in bed sleeping, respirations are even/unlabored, environment check complete secure, will continue to monitor patient for safety 

## 2021-01-01 NOTE — ED Notes (Signed)
Patient participated in wrap up group by discussing with staff ways to use her coping skills ( singing, listening to music) once she is discharged from the hospital. Staff continued to commend patient for her interaction in group.

## 2021-01-01 NOTE — Progress Notes (Signed)
Patient is alert and oriented X 4. Patient denies SI, but has thoughts of hurting stepfather with no plan. Patient denies Auditory or Visual Hallucinations at this time. Patient can seem to be disorganized or thought blocking. Patient states she feels safe while in the hospital, expressed poor sleep, and it was reported that patient was up during the night and early morning. Nursing staff will continue to monitor.

## 2021-01-01 NOTE — Progress Notes (Signed)
Patient attending groups, appropriate to peers and staff. Patient states " I know what I have to do now because the outburst was uncalled for, but I think Friday would be good for me to leave."

## 2021-01-01 NOTE — ED Notes (Signed)
Pt sitting in dining room watching TV. A&O x4, calm and cooperative. Pt denies current SI/HI/AVH. Pt denies any current needs. No signs of acute distress noted. Will continue to monitor for safety.

## 2021-01-01 NOTE — ED Notes (Signed)
Pt requesting to see provider because "there's something very wrong with my tooth back there." RN offered PRN Tylenol, pt declined. Nira Conn, NP notified.

## 2021-01-01 NOTE — ED Notes (Signed)
Patient participated in wrap group this evening Patient was also positive about her experience here. She also has expressed that she is happy to be here and it has helped her with her PTSD with her Father and that she is now able to move forward with her life and no longer worry about about others and is putting herself first.

## 2021-01-02 MED ORDER — SERTRALINE HCL 20 MG/ML PO CONC: 25.0000 mg | Freq: Every day | ORAL | 0 refills | Status: DC

## 2021-01-02 NOTE — Progress Notes (Signed)
Pt is currently watching TV in the dinning room. No signs of acute distress noted. Pt had lunch. Pt's safety is maintained.
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
## 2021-01-02 NOTE — ED Notes (Signed)
Pt asleep in bed. Respirations even and unlabored. Will continue to monitor for safety. ?
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
## 2021-01-02 NOTE — ED Notes (Signed)
Patient A&O x 4, ambulatory. Patient discharged in no acute distress. Patient denied SI/HI, A/VH upon discharge. Patient verbalized understanding of all discharge instructions explained by staff, to include follow up appointments, medications and safety plan. Patient reported mood 10/10.  Pt belongings returned to patient from locker #15 intact. Patient escorted to [place] via staff for transport to home. Safety maintained.

## 2021-01-02 NOTE — Progress Notes (Signed)
Pt is awake, alert and oriented. Pt did not voice any complaints of pain or discomfort. No distress noted. Administered scheduled medication with no incident. Pt denies current SI/HI/AVH. Staff will monitor for pt's safety.

## 2021-01-02 NOTE — ED Provider Notes (Signed)
FBC/OBS ASAP Discharge Summary  Date and Time: 01/02/2021 3:15 PM  Name: Elizabeth Tran  MRN:  [id number]   Discharge Diagnoses:  Final diagnoses:  Severe episode of recurrent major depressive disorder, without psychotic features (HCC)  PTSD (post-traumatic stress disorder)  GAD (generalized anxiety disorder)    Subjective:  On interview today patient appears bright and rdescribes her mood as "good" and states that she feels ready for discharge. She denies SI/HI/AVH. She discusses her safety plan in the event she feels suicidal in the future. She expresses that her stay at the [facility] has been helpful and that she has learned a lot from groups and worksheets provided. She requests letter for school upon discharge. 30 day supply of medication sent to pharmacy of choice.   Stay Summary:  Patient presented to the [facility] on 9/5 with SI and was admitted to the [facility]. She was started on zoloft 25 mg daily for mood on 9/5; patient preferred liquid form due to difficulty swallowing pills. Patient tolerated medication well and attended groups throughout her stay. On day of discharge she denied SI/HI/AVH - see above for more details. 30 day rx sent to pharmacy of choice.   Total Time spent with patient: 20 min  Past Psychiatric History: see H&P Past Medical History: No past medical history on file.  Family History: No family history on file. Family Psychiatric History: see H&P Social History:  Social History   Substance and Sexual Activity  Alcohol Use None     Social History   Substance and Sexual Activity  Drug Use Not on file    Social History   Socioeconomic History   Marital status: Single    Spouse name: Not on file   Number of children: Not on file   Years of education: Not on file   Highest education level: Not on file  Occupational History   Not on file  Tobacco Use   Smoking status: Never    Passive exposure: Never   Smokeless tobacco: Never  Substance and Sexual Activity    Alcohol use: Not on file   Drug use: Not on file   Sexual activity: Not on file  Other Topics Concern   Not on file  Social History Narrative   Not on file   Social Determinants of Health   Financial Resource Strain: Not on file  Food Insecurity: Not on file  Transportation Needs: Not on file  Physical Activity: Not on file  Stress: Not on file  Social Connections: Not on file   SDOH:  SDOH Screenings   Alcohol Screen: Not on file  Depression (PHQ2-9): Not on file  Financial Resource Strain: Not on file  Food Insecurity: Not on file  Housing: Not on file  Physical Activity: Not on file  Social Connections: Not on file  Stress: Not on file  Tobacco Use: Low Risk    Smoking Tobacco Use: Never   Smokeless Tobacco Use: Never  Transportation Needs: Not on file    Tobacco Cessation:  N/A, patient does not currently use tobacco products  Current Medications:  Current Facility-Administered Medications  Medication Dose Route Frequency Provider Last Rate Last Admin   acetaminophen (TYLENOL) tablet 650 mg  650 mg Oral Q6H PRN Bobbitt, Shalon E, NP       alum & mag hydroxide-simeth (MAALOX/MYLANTA) 200-200-20 MG/5ML suspension 30 mL  30 mL Oral Q4H PRN Bobbitt, Shalon E, NP       hydrOXYzine (ATARAX/VISTARIL) tablet 25 mg  25 mg Oral  TID PRN Bobbitt, Shalon E, NP       magnesium hydroxide (MILK OF MAGNESIA) suspension 30 mL  30 mL Oral Daily PRN Bobbitt, Shalon E, NP       sertraline (ZOLOFT) 20 MG/ML concentrated solution 25 mg  25 mg Oral Daily Estella Husk, MD   25 mg at 01/02/21 0914   traZODone (DESYREL) tablet 50 mg  50 mg Oral QHS PRN Bobbitt, Shalon E, NP       Current Outpatient Medications  Medication Sig Dispense Refill   famotidine (PEPCID) 20 MG tablet Take 20 mg by mouth daily as needed for heartburn or indigestion.      PTA Medications: (Not in a hospital admission)   Musculoskeletal  Strength & Muscle Tone: within normal limits Gait & Station:  normal Patient leans: N/A  Psychiatric Specialty Exam  Presentation  General Appearance: Casual; Appropriate for Environment  Eye Contact:Fleeting  Speech:Blocked; Clear and Coherent  Speech Volume:Normal  Handedness:Right   Mood and Affect  Mood:Dysphoric  Affect:Congruent; Other (comment) (generally blunted but appropriately reactive)   Thought Process  Thought Processes:Coherent; Goal Directed; Linear  Descriptions of Associations:Intact  Orientation:Full (Time, Place and Person)  Thought Content:WDL; Logical  Diagnosis of Schizophrenia or Schizoaffective disorder in past: No    Hallucinations:Hallucinations: None  Ideas of Reference:None  Suicidal Thoughts:Suicidal Thoughts: No  Homicidal Thoughts:Homicidal Thoughts: No   Sensorium  Memory:Immediate Good; Recent Good; Remote Good  Judgment:Fair  Insight:Fair   Executive Functions  Concentration:Good  Attention Span:Good  Recall:Good  Fund of Knowledge:Good  Language:Good   Psychomotor Activity  Psychomotor Activity:Psychomotor Activity: Normal   Assets  Assets:Communication Skills; Desire for Improvement; Financial Resources/Insurance; Housing; Leisure Time; Physical Health; Resilience; Vocational/Educational   Sleep  Sleep:Sleep: Fair   No data recorded  Physical Exam  See SRA for physical exam and ROS Blood pressure (!) 113/56, pulse 69, temperature (!) 96.6 F (35.9 C), temperature source Tympanic, resp. rate 16, SpO2 100 %. There is no height or weight on file to calculate BMI.  See SRA for full suicide risk assessment  Plan Of Care/Follow-up recommendations:  Activity:  as toleratefd Diet:  regular Other:    Patient is instructed prior to discharge to: Take all medications as prescribed by his/her mental healthcare provider. Report any adverse effects and or reactions from the medicines to his/her outpatient provider promptly. Patient has been instructed & cautioned: To  not engage in alcohol and or illegal drug use while on prescription medicines. In the event of worsening symptoms, patient is instructed to call the crisis hotline, 911 and or go to the nearest ED for appropriate evaluation and treatment of symptoms. To follow-up with his/her primary care provider for your other medical issues, concerns and or health care needs.     Disposition: home via safe transport  Estella Husk, MD 01/02/2021, 3:15 PM

## 2021-01-02 NOTE — Discharge Instructions (Addendum)

## 2021-01-02 NOTE — Clinical Social Work Psych Note (Signed)
Cognitive Distortions & Restructuring   Cognitive Distortions & Cognitive Restructuring (CBT & DBT Skills)  Date: 01/02/21  Type of Therapy/Therapeutic Modalities:  Participation Level: Active  Objective: The purpose of this group is to discuss and assist patients in identifying cognitive distortions (negative thinking patterns) that can influence their emotions and behaviors. Facilitators will guide conversations that discuss how these cognitive distortions contribute to common disorders such as anxiety and depression.   Therapeutic Goals:  Patient will identify negative thinking patterns they currently experience and how those thoughts influence their behaviors.  Patient will begin to explore the possible misinformation and/or traumatic experiences that influence their negative thought patterns.  Patient will explore the foundations and techniques of cognitive restructuring by reviewing CBT and DBT techniques.  Patient will discuss the   Summary of Patient's Progress:  Robynn was engaged and participated throughout the group session. Brandyce shared how she believes her anxiety symptoms are heavily effected by her negative thinking patterns.   Jenetta reviewed the CBT Model and is working on Personal assistant that challenge her to "Put Her Thoughts on Trial", "Challenging Negative Thinking Patterns" and utilizing the CBT model with real life examples.

## 2021-07-05 ENCOUNTER — Emergency Department (HOSPITAL_COMMUNITY)
Admission: EM | Admit: 2021-07-05 | Discharge: 2021-07-06 | Disposition: A | Discharge status: Home / Self Care | Payer: BC Managed Care – PPO | Attending: Emergency Medicine | Admitting: Emergency Medicine

## 2021-07-05 DIAGNOSIS — F1290 Cannabis use, unspecified, uncomplicated: Secondary | ICD-10-CM | POA: Diagnosis present

## 2021-07-05 MED ORDER — ONDANSETRON 4 MG PO TBDP
4.0000 mg | ORAL_TABLET | Freq: Once | ORAL | Status: AC
  Administered 2021-07-05: 4 mg via ORAL
  Filled 2021-07-05: qty 1

## 2021-07-05 NOTE — ED Provider Notes (Signed)
?[facility]-EMERGENCY DEPT ?Provider Note ? ? ?CSN: [id number] ?Arrival date & time: 07/05/21  2311 ? ?  ? ?History ? ?Chief Complaint  ?Patient presents with  ? Drug Overdose  ? Ingestion  ? ? ?Elizabeth Tran is a 21 y.o. female. ? ?The history is provided by the patient and medical records.  ?Drug Overdose ? ?Ingestion ? ? ?20 y.o. F here after eating edibles.  Was with boyfriend, both took them and both with adverse side effects so EMS was called.  Patient with some dry heaving en route but no vomiting.  No co-ingestion.   ? ?Home Medications ?Prior to Admission medications   ?Medication Sig Start Date End Date Taking? Authorizing Provider  ?famotidine (PEPCID) 20 MG tablet Take 20 mg by mouth daily as needed for heartburn or indigestion.    [provider]  ?sertraline (ZOLOFT) 20 MG/ML concentrated solution Take 1.3 mLs (26 mg total) by mouth daily. 01/03/21 02/02/21  Estella Husk, MD  ?   ? ?Allergies    ?Patient has no known allergies.   ? ?Review of Systems   ?Review of Systems  ?Constitutional:   ?     Ingestion  ?All other systems reviewed and are negative. ? ?Physical Exam ?Updated Vital Signs ?BP 119/90   Pulse 83   Resp 12   LMP  (LMP Unknown)   SpO2 100%  ? ?Physical Exam ?Vitals and nursing note reviewed.  ?Constitutional:   ?   Appearance: She is well-developed.  ?   Comments: Drowsy appearing on exam but does shake head to questioning  ?HENT:  ?   Head: Normocephalic and atraumatic.  ?Eyes:  ?   Conjunctiva/sclera: Conjunctivae normal.  ?   Pupils: Pupils are equal, round, and reactive to light.  ?Cardiovascular:  ?   Rate and Rhythm: Normal rate and regular rhythm.  ?   Heart sounds: Normal heart sounds.  ?Pulmonary:  ?   Effort: Pulmonary effort is normal. No respiratory distress.  ?   Breath sounds: Normal breath sounds. No rhonchi.  ?Abdominal:  ?   General: Bowel sounds are normal.  ?   Palpations: Abdomen is soft.  ?Musculoskeletal:     ?   General:  Normal range of motion.  ?   Cervical back: Normal range of motion.  ?Skin: ?   General: Skin is warm and dry.  ?Neurological:  ?   Mental Status: She is oriented to person, place, and time.  ? ? ?ED Results / Procedures / Treatments   ?Labs ?(all labs ordered are listed, but only abnormal results are displayed) ?Labs Reviewed - No data to display ? ?EKG ?None ? ?Radiology ?No results found. ? ?Procedures ?Procedures  ? ? ?Medications Ordered in ED ?Medications  ?ondansetron (ZOFRAN-ODT) disintegrating tablet 4 mg (4 mg Oral Given 07/05/21 2327)  ? ? ?ED Course/ Medical Decision Making/ A&P ?  ?                        ?Medical Decision Making ?Risk ?Prescription drug management. ? ? ?79 y.o. F here after eating edible with boyfriend. He is being seen for same.  No other co-ingestion.  Dry heaving with EMS but no vomiting.  VSS.  She is drowsy but arouses to voices and shakes head yes and no to questions.  Given zofran.  Will monitor. ? ?3:42 AM ?Resting comfortably, still drowsy.  VSS.  No vomiting.  Will conitnue to observe.  ? ?  4:20 AM ?Up to restroom but gait is shaky when walking.   Back to bed, will continue to monitor until a little more stable. ? ?5:31 AM ?More awake/alert.  Able to text on phone.  Stable for discharge.  Advised to refrain from edibles in the future.  Can return here for new concerns. ? ?Final diagnoses:  ?Use of cannabinoid edibles  ? ? ?Rx / DC Orders ?ED Discharge Orders   ? ? None  ? ?  ? ? ?  ?Garlon Hatchet, PA-C ?07/06/21 0531 ? ?  ?Sabas Sous, MD ?07/06/21 [phone] ? ?

## 2021-07-05 NOTE — ED Triage Notes (Signed)
Pt took whole edible within an hour. She is breathing WNL but not responsive.  ?

## 2021-07-06 NOTE — ED Notes (Addendum)
Pt has roused and asked to use restroom. She ambulated to restroom with shuffling gait very slowly holding on to wall. Follows commands. She did use commode and now back in bed.  ?

## 2021-07-06 NOTE — Discharge Instructions (Signed)
Avoid use of edibles in the future. ?

## 2021-07-06 NOTE — ED Notes (Signed)
Pt is now texting on phone.  ?

## 2021-07-15 ENCOUNTER — Other Ambulatory Visit (HOSPITAL_BASED_OUTPATIENT_CLINIC_OR_DEPARTMENT_OTHER): Payer: Self-pay | Admitting: Family Medicine

## 2021-07-15 DIAGNOSIS — R270 Ataxia, unspecified: Secondary | ICD-10-CM

## 2021-07-17 ENCOUNTER — Ambulatory Visit (HOSPITAL_BASED_OUTPATIENT_CLINIC_OR_DEPARTMENT_OTHER)
Admission: RE | Admit: 2021-07-17 | Discharge: 2021-07-17 | Disposition: A | Discharge status: Home / Self Care | Payer: BC Managed Care – PPO | Source: Ambulatory Visit | Attending: Family Medicine | Admitting: Family Medicine

## 2021-07-17 ENCOUNTER — Other Ambulatory Visit: Payer: Self-pay

## 2021-07-17 DIAGNOSIS — R270 Ataxia, unspecified: Secondary | ICD-10-CM | POA: Diagnosis present

## 2021-07-17 DIAGNOSIS — R9082 White matter disease, unspecified: Secondary | ICD-10-CM | POA: Insufficient documentation

## 2021-07-17 IMAGING — MR MR HEAD WO/W CM
12 series · 48 of 48 positions shown · IV contrast (GADAVIST)
Comparison: None.

CLINICAL DATA: Ataxia and dizziness

EXAM:
MRI HEAD WITHOUT AND WITH CONTRAST
TECHNIQUE: Multiplanar, multiecho pulse sequences of the brain and surrounding
structures were obtained without and with intravenous contrast.
CONTRAST:  9.4mL GADAVIST GADOBUTROL 1 MMOL/ML IV SOLN

[Series 2: DWI · axial · 3.0mm · 1.46mm/px · z∈[-18,+141]mm · 6 of 110 slices shown (1 of 4)]
[im 1/110]
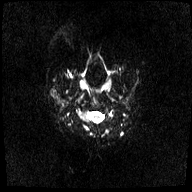
[im 22/110]
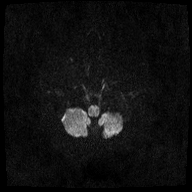
[im 44/110]
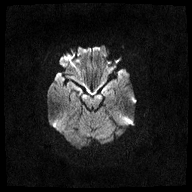
[im 66/110]
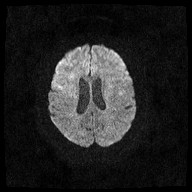
[im 88/110]
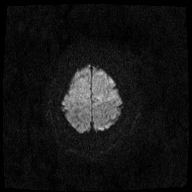
[im 110/110]
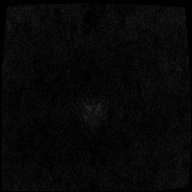

[Series 3: DWI · axial · 3.0mm · 1.46mm/px · z∈[-18,+141]mm · 3 of 53 slices shown (2 of 4)]
[im 1/53]
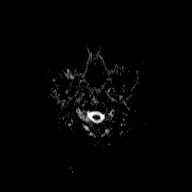
[im 27/53]
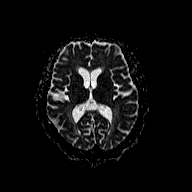
[im 53/53]
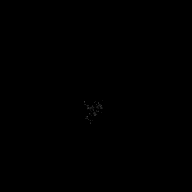

[Series 4: DWI · coronal · 5.0mm · 1.46mm/px · 4 of 66 slices shown (3 of 4)]
[im 1/66]
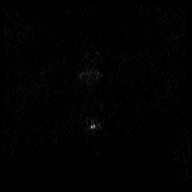
[im 22/66]
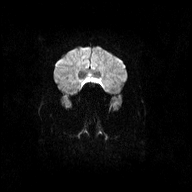
[im 44/66]
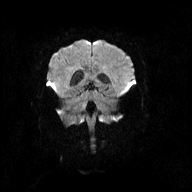
[im 66/66]
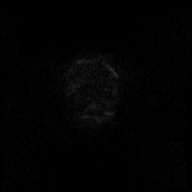

[Series 5: DWI · coronal · 5.0mm · 1.46mm/px · 2 of 33 slices shown (4 of 4)]
[im 1/33]
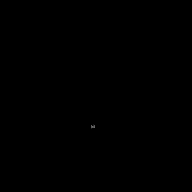
[im 33/33]
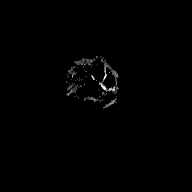

[Series 6: T1 · sagittal · 5.0mm · 0.45mm/px · 2 of 26 slices shown (1 of 2)]
[im 1/26]
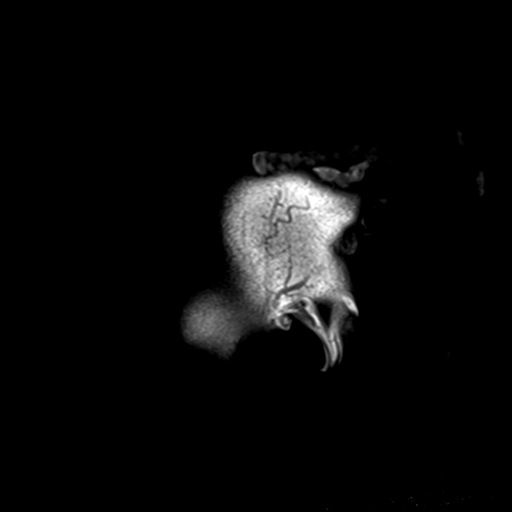
[im 26/26]
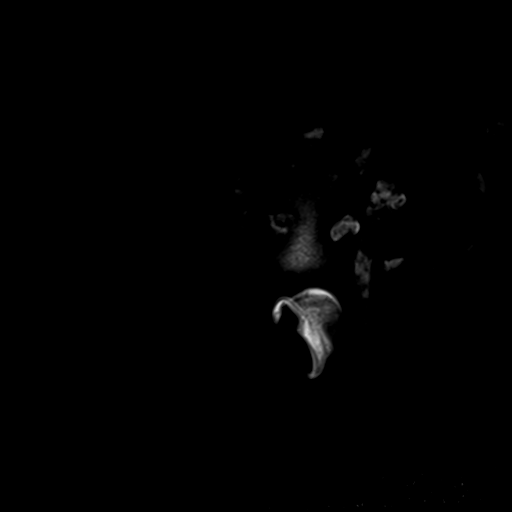

[Series 7: T2 · axial · 5.0mm · 0.72mm/px · z∈[-26,+137]mm · 2 of 25 slices shown (1 of 2)]
[im 1/25]
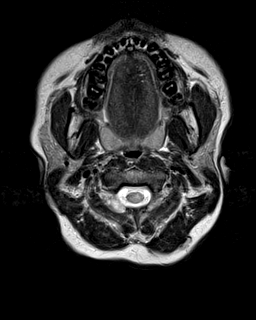
[im 25/25]
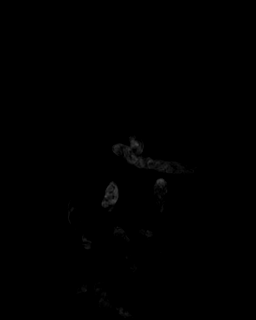

[Series 8: FLAIR · axial · 3.0mm · 0.45mm/px · z∈[-23,+134]mm · 3 of 55 slices shown]
[im 1/55]
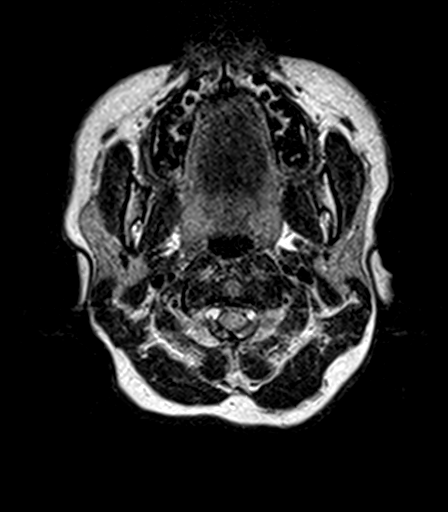
[im 28/55]
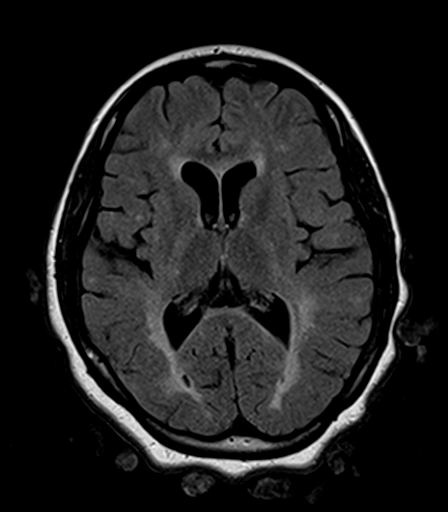
[im 55/55]
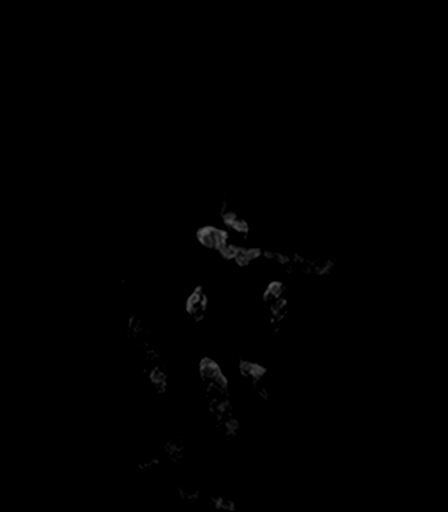

[Series 9: T2 · axial · 5.0mm · 0.72mm/px · z∈[-26,+137]mm · 2 of 25 slices shown (2 of 2)]
[im 1/25]
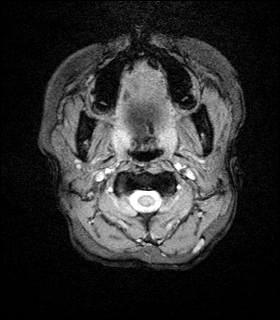
[im 25/25]
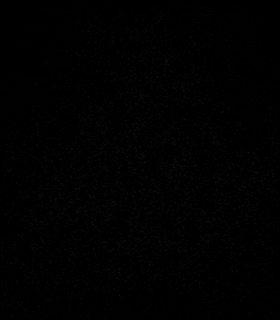

[Series 10: T1 · axial · 1.0mm · 0.94mm/px · z∈[-20,+135]mm · 10 of 160 slices shown (2 of 2)]
[im 1/160]
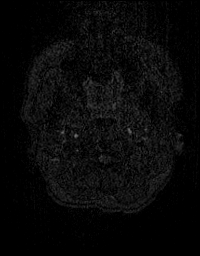
[im 18/160]
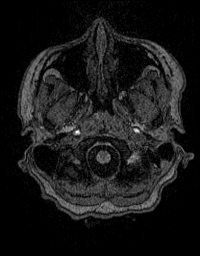
[im 36/160]
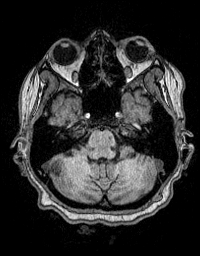
[im 54/160]
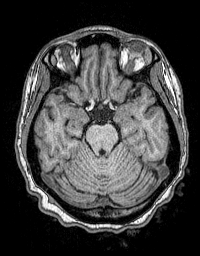
[im 71/160]
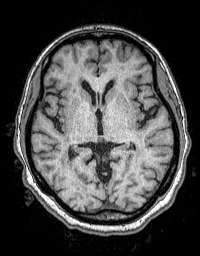
[im 89/160]
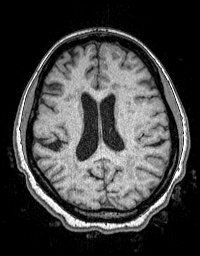
[im 107/160]
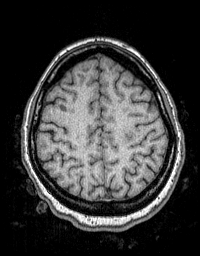
[im 124/160]
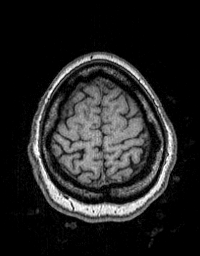
[im 142/160]
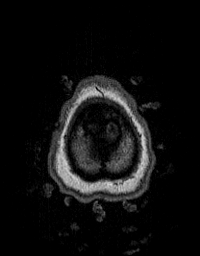
[im 160/160]
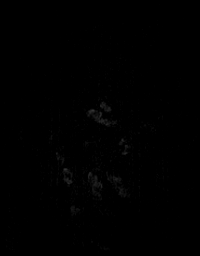

[Series 11: T2 post-contrast · coronal · 5.0mm · 0.43mm/px · 2 of 31 slices shown]
[im 1/31]
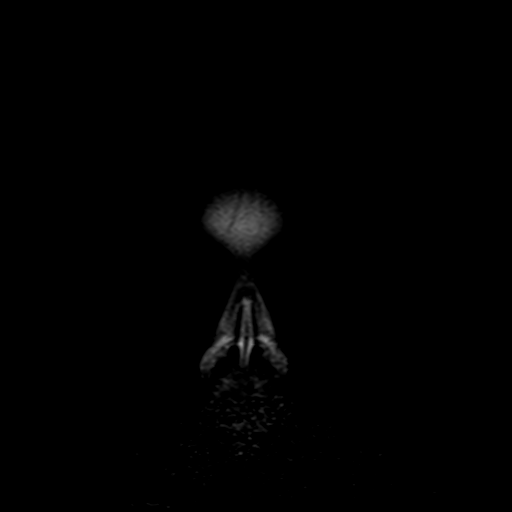
[im 31/31]
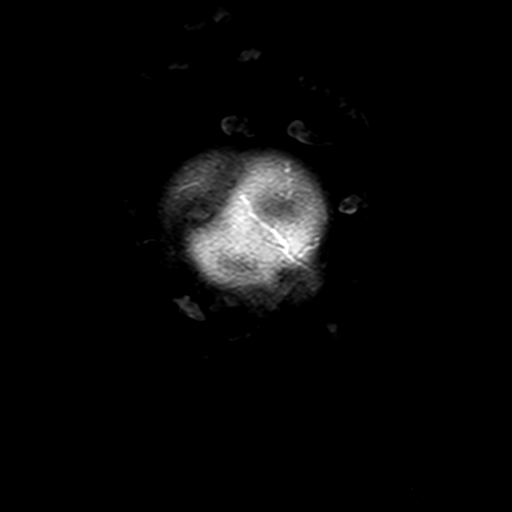

[Series 12: T1 post-contrast · axial · 1.0mm · 0.94mm/px · z∈[-20,+135]mm · 10 of 160 slices shown (1 of 2)]
[im 1/160]
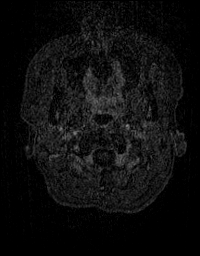
[im 18/160]
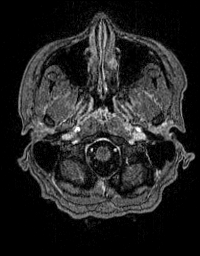
[im 36/160]
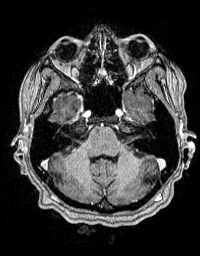
[im 54/160]
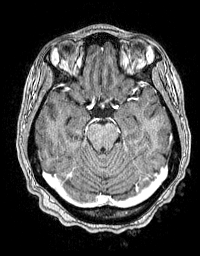
[im 71/160]
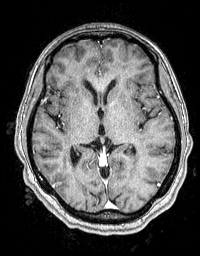
[im 89/160]
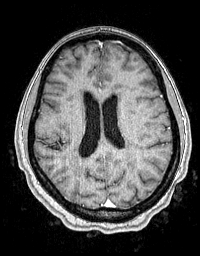
[im 107/160]
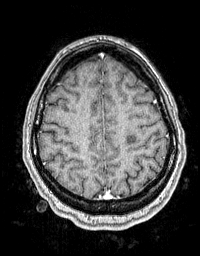
[im 124/160]
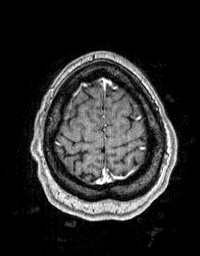
[im 142/160]
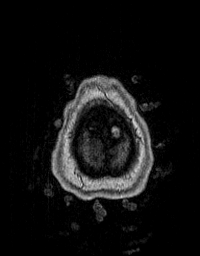
[im 160/160]
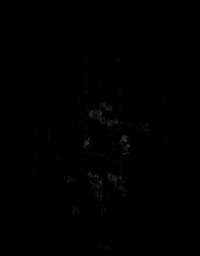

[Series 13: T1 post-contrast · coronal · 5.0mm · 0.43mm/px · 2 of 31 slices shown (2 of 2)]
[im 1/31]
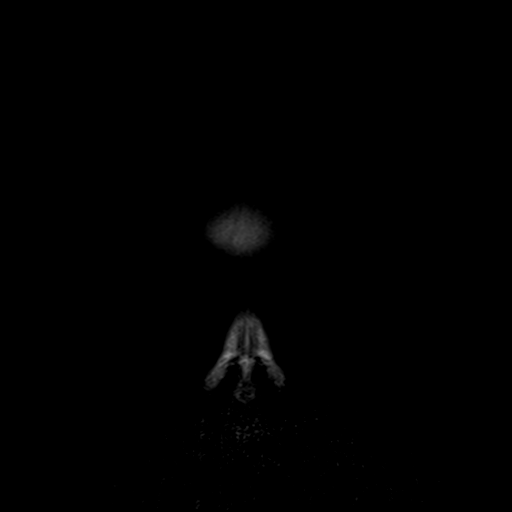
[im 31/31]
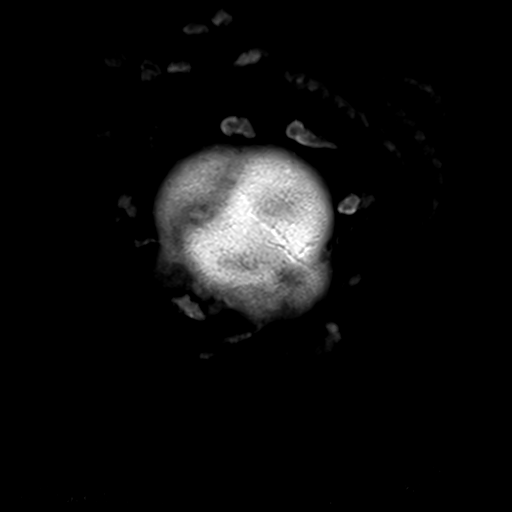

[48 of 48 positions shown; findings below may reference images not displayed]

FINDINGS: Brain: No acute infarct, mass effect or extra-axial collection. No
acute or chronic hemorrhage. There is severe bilateral white matter
disease with multiple hyperintense T2-weighted signal lesions within
both hemispheres. There are lesions of the right brainstem and right
middle cerebellar peduncle. The midline structures are normal. There
is no abnormal contrast enhancement.

Vascular: Major flow voids are preserved.

Skull and upper cervical spine: Normal calvarium and skull base.
Visualized upper cervical spine and soft tissues are normal.

Sinuses/Orbits:No paranasal sinus fluid levels or advanced mucosal
thickening. No mastoid or middle ear effusion. Normal orbits.
IMPRESSION: 1. No acute intracranial abnormality.
2. Severe bilateral white matter disease. This may be due to
demyelinating disease or severe chronic ischemic disease. This
degree of white matter abnormality is unusual in such a young
patient.

## 2021-07-17 MED ORDER — GADOBUTROL 1 MMOL/ML IV SOLN
9.4000 mL | Freq: Once | INTRAVENOUS | Status: AC | PRN
  Administered 2021-07-17: 9.4 mL via INTRAVENOUS
  Filled 2021-07-17: qty 10

## 2021-07-21 ENCOUNTER — Other Ambulatory Visit: Payer: Self-pay | Admitting: Internal Medicine

## 2021-07-21 DIAGNOSIS — G379 Demyelinating disease of central nervous system, unspecified: Secondary | ICD-10-CM

## 2021-07-21 NOTE — Progress Notes (Signed)
Referral for new diagnosis of demyelinating disorder ?

## 2021-07-21 NOTE — Progress Notes (Incomplete)
Referral for new diagnosis of demyelinating disorder ?

## 2021-07-23 NOTE — Progress Notes (Signed)
? ?NEUROLOGY CONSULTATION NOTE ? ?Elizabeth Tran ?MRN: [id number] ?DOB: 2001/03/06 ? ?Referring provider: Tami Lin, MD ?Primary care provider: Rolanda Jay, MD ? ?Reason for consult:  demyelinating disease ? ?Assessment/Plan:  ? ?Probable multiple sclerosis - she has had progression of symptoms over the past week causing numbness in the lower extremities and increased ataxia causing falls.  She is likely having an active flare.  Due to this significant progression of symptoms, I have advised her to go to [facility] for admission to expedite workup and treatment (IV Solu-Medrol). ? ?Will send to [facility] ?Follow up after discharge to discuss further ongoing testing and management ? ? ?Subjective:  ?Elizabeth Tran is a 21 year old female with GAD, PTSD who presents for demyelinating disease.  History supplemented by referring provider's note.  MRI brain personally reviewed. ? ?Earlier this month, she began feeling off balance.  At first it ws believed to be side effects of eating edibles.  She was seen in the ED where labs were unremarkable and she was treated with IVF.  She was then started on fluoxetine.  She then started noticing that she was leaning towards the right and felt dizzy and lightheaded when turning her head or walking.  She would have associated blurred vision with these spells.  She had an MRI of the brain with and without contrast on 07/17/2021 which showed severe white matter disease without abnormal enhancement, including the bilateral cerebral hemispheres, right side of brainstem and right middle cerebellar peduncle, concerning for demyelinating disease.  On 3/27, she started experiencing numbness in the legs up to the abdomen and feels a tightness in her abdomen.  Notes weakness articularly in the right leg.  No appetite.  She wants to sleep all of the time.  Balance has progressively gotten worse.   ?  ?07/17/2021 MRI BRAIN W WO:  No acute infarct, mass effect or extra-axial  collection. No acute or chronic hemorrhage. There is severe bilateral white matter disease with multiple hyperintense T2-weighted signal lesions within both hemispheres. There are lesions of the right brainstem and right middle cerebellar peduncle. The midline structures are normal. There is no abnormal contrast enhancement. ? ?PAST MEDICAL HISTORY: ?No past medical history on file. ? ? ?MEDICATIONS: ?Current Outpatient Medications on File Prior to Visit  ?Medication Sig Dispense Refill  ? famotidine (PEPCID) 20 MG tablet Take 20 mg by mouth daily as needed for heartburn or indigestion.    ? sertraline (ZOLOFT) 20 MG/ML concentrated solution Take 1.3 mLs (26 mg total) by mouth daily. 39 mL 0  ? ?No current facility-administered medications on file prior to visit.  ? ? ?ALLERGIES: ?No Known Allergies ? ?FAMILY HISTORY: ?No family history on file. ? ?Objective:  ?Blood pressure 120/80, pulse 78, resp. rate 18, weight 191 lb (86.6 kg), SpO2 96 %. ?General: No acute distress.  Patient appears well-groomed.   ?Head:  Normocephalic/atraumatic ?Eyes:  fundi examined but not visualized ?Neck: supple, no paraspinal tenderness, full range of motion ?Back: No paraspinal tenderness ?Heart: regular rate and rhythm ?Lungs: Clear to auscultation bilaterally. ?Vascular: No carotid bruits. ?Neurological Exam: ?Mental status: alert and oriented to person, place, and time, recent and remote memory intact, fund of knowledge intact, attention and concentration intact, speech fluent and not dysarthric, language intact. ?Cranial nerves: ?CN I: not tested ?CN II: pupils equal, round and reactive to light, visual fields intact ?CN III, IV, VI:  full range of motion, no nystagmus, no ptosis ?CN V: facial sensation intact. ?  CN VII: upper and lower face symmetric ?CN VIII: hearing intact ?CN IX, X: gag intact, uvula midline ?CN XI: sternocleidomastoid and trapezius muscles intact ?CN XII: tongue midline ?Bulk & Tone: normal, no  fasciculations. ?Motor:  muscle strength 4/5 right EHL otherwise 5/5 throughout ?Sensation:  Pinprick, temperature and vibratory sensation intact. ?Deep Tendon Reflexes:  3+ throughout,  toes downgoing.   ?Finger to nose testing:  Without dysmetria.   ?Gait:  Ataxic.  Romberg positive ? ? ? ?Thank you for allowing me to take part in the care of this patient. ? ?Metta Clines, DO ? ?CC:  Rolanda Jay, MD ? Tami Lin, MD ? ? ? ? ?

## 2021-07-25 ENCOUNTER — Encounter (HOSPITAL_COMMUNITY): Payer: Self-pay | Admitting: Emergency Medicine

## 2021-07-25 ENCOUNTER — Other Ambulatory Visit: Payer: Self-pay

## 2021-07-25 ENCOUNTER — Encounter: Payer: Self-pay | Admitting: Neurology

## 2021-07-25 ENCOUNTER — Inpatient Hospital Stay (HOSPITAL_COMMUNITY): Payer: BC Managed Care – PPO

## 2021-07-25 ENCOUNTER — Encounter (HOSPITAL_COMMUNITY): Payer: Self-pay

## 2021-07-25 ENCOUNTER — Ambulatory Visit: Payer: BC Managed Care – PPO | Admitting: Neurology

## 2021-07-25 ENCOUNTER — Inpatient Hospital Stay (HOSPITAL_COMMUNITY)
Admission: EM | Admit: 2021-07-25 | Discharge: 2021-07-30 | DRG: 060 | Disposition: A | Discharge status: Home / Self Care | Payer: BC Managed Care – PPO | Source: Ambulatory Visit | Attending: Internal Medicine | Admitting: Internal Medicine

## 2021-07-25 VITALS — BP 120/80 | HR 78 | Resp 18 | Wt 191.0 lb

## 2021-07-25 DIAGNOSIS — G379 Demyelinating disease of central nervous system, unspecified: Secondary | ICD-10-CM

## 2021-07-25 DIAGNOSIS — R9089 Other abnormal findings on diagnostic imaging of central nervous system: Secondary | ICD-10-CM

## 2021-07-25 DIAGNOSIS — Z79899 Other long term (current) drug therapy: Secondary | ICD-10-CM

## 2021-07-25 DIAGNOSIS — Z9151 Personal history of suicidal behavior: Secondary | ICD-10-CM

## 2021-07-25 DIAGNOSIS — E559 Vitamin D deficiency, unspecified: Secondary | ICD-10-CM | POA: Diagnosis present

## 2021-07-25 DIAGNOSIS — F319 Bipolar disorder, unspecified: Secondary | ICD-10-CM | POA: Diagnosis present

## 2021-07-25 DIAGNOSIS — Z87891 Personal history of nicotine dependence: Secondary | ICD-10-CM

## 2021-07-25 DIAGNOSIS — F322 Major depressive disorder, single episode, severe without psychotic features: Secondary | ICD-10-CM | POA: Diagnosis not present

## 2021-07-25 DIAGNOSIS — R42 Dizziness and giddiness: Secondary | ICD-10-CM | POA: Diagnosis not present

## 2021-07-25 DIAGNOSIS — G35 Multiple sclerosis: Secondary | ICD-10-CM | POA: Diagnosis present

## 2021-07-25 DIAGNOSIS — R200 Anesthesia of skin: Secondary | ICD-10-CM

## 2021-07-25 DIAGNOSIS — D509 Iron deficiency anemia, unspecified: Secondary | ICD-10-CM | POA: Diagnosis present

## 2021-07-25 DIAGNOSIS — R339 Retention of urine, unspecified: Secondary | ICD-10-CM | POA: Diagnosis present

## 2021-07-25 DIAGNOSIS — F32A Depression, unspecified: Secondary | ICD-10-CM

## 2021-07-25 DIAGNOSIS — R270 Ataxia, unspecified: Secondary | ICD-10-CM

## 2021-07-25 DIAGNOSIS — D649 Anemia, unspecified: Secondary | ICD-10-CM

## 2021-07-25 DIAGNOSIS — E876 Hypokalemia: Secondary | ICD-10-CM

## 2021-07-25 LAB — CBC WITH DIFFERENTIAL/PLATELET
Abs Immature Granulocytes: 0.01 10*3/uL (ref 0.00–0.07)
Basophils Absolute: 0 10*3/uL (ref 0.0–0.1)
Basophils Relative: 1 %
Eosinophils Absolute: 0.3 10*3/uL (ref 0.0–0.5)
Eosinophils Relative: 5 %
HCT: 33.6 % — ABNORMAL LOW (ref 36.0–46.0)
Hemoglobin: 11.2 g/dL — ABNORMAL LOW (ref 12.0–15.0)
Immature Granulocytes: 0 %
Lymphocytes Relative: 27 %
Lymphs Abs: 1.5 10*3/uL (ref 0.7–4.0)
MCH: 29.1 pg (ref 26.0–34.0)
MCHC: 33.3 g/dL (ref 30.0–36.0)
MCV: 87.3 fL (ref 80.0–100.0)
Monocytes Absolute: 0.5 10*3/uL (ref 0.1–1.0)
Monocytes Relative: 10 %
Neutro Abs: 3 10*3/uL (ref 1.7–7.7)
Neutrophils Relative %: 57 %
Platelets: 253 10*3/uL (ref 150–400)
RBC: 3.85 MIL/uL — ABNORMAL LOW (ref 3.87–5.11)
RDW: 13 % (ref 11.5–15.5)
WBC: 5.3 10*3/uL (ref 4.0–10.5)
nRBC: 0 % (ref 0.0–0.2)

## 2021-07-25 LAB — URINALYSIS, ROUTINE W REFLEX MICROSCOPIC
Bilirubin Urine: NEGATIVE
Glucose, UA: NEGATIVE mg/dL
Hgb urine dipstick: NEGATIVE
Ketones, ur: NEGATIVE mg/dL
Leukocytes,Ua: NEGATIVE
Nitrite: NEGATIVE
Protein, ur: NEGATIVE mg/dL
Specific Gravity, Urine: 1.029 (ref 1.005–1.030)
pH: 6 (ref 5.0–8.0)

## 2021-07-25 LAB — BASIC METABOLIC PANEL
Anion gap: 10 (ref 5–15)
BUN: 8 mg/dL (ref 6–20)
CO2: 23 mmol/L (ref 22–32)
Calcium: 9 mg/dL (ref 8.9–10.3)
Chloride: 104 mmol/L (ref 98–111)
Creatinine, Ser: 0.64 mg/dL (ref 0.44–1.00)
GFR, Estimated: >60 mL/min (ref >60)
Glucose, Bld: 85 mg/dL (ref 70–99)
Potassium: 3.4 mmol/L — ABNORMAL LOW (ref 3.5–5.1)
Sodium: 137 mmol/L (ref 135–145)

## 2021-07-25 LAB — I-STAT BETA HCG BLOOD, ED (MC, WL, AP ONLY): I-stat hCG, quantitative: <5 m[IU]/mL (ref <5)

## 2021-07-25 IMAGING — MR MR CERVICAL SPINE WO/W CM
6 of 8 series · 30 of 48 positions shown · IV contrast (gadavist)
Comparison: None.

CLINICAL DATA: Initial evaluation for demyelinating disease.

EXAM:
MRI CERVICAL SPINE WITHOUT AND WITH CONTRAST
TECHNIQUE: Multiplanar and multiecho pulse sequences of the cervical spine, to
include the craniocervical junction and cervicothoracic junction,
were obtained without and with intravenous contrast.
CONTRAST:  9mL GADAVIST GADOBUTROL 1 MMOL/ML IV SOLN

[Series 20: T2 · sagittal · 3.0mm · 0.69mm/px · 3 of 15 slices shown (1 of 2)]
[im 1/15]
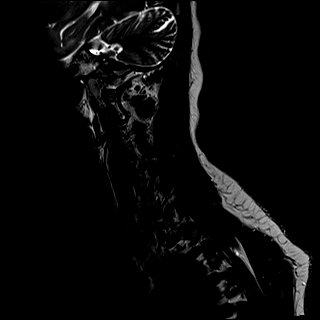
[im 8/15]
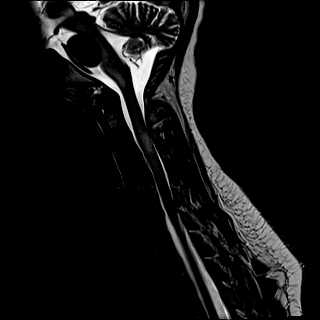
[im 15/15]
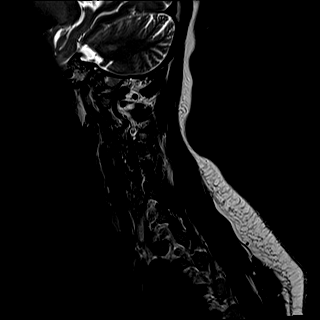

[Series 21: T1 · sagittal · 3.0mm · 0.69mm/px · 3 of 15 slices shown (1 of 2)]
[im 1/15]
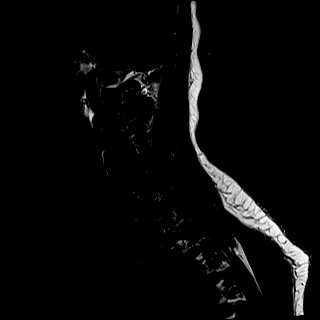
[im 8/15]
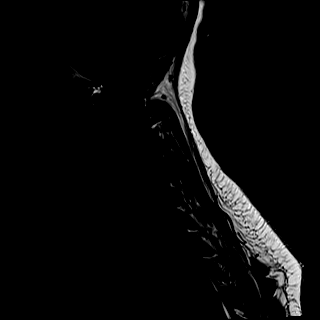
[im 15/15]
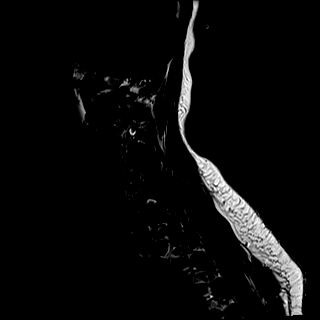

[Series 22: STIR · sagittal · 3.0mm · 0.86mm/px · 3 of 15 slices shown]
[im 1/15]
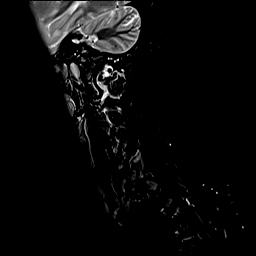
[im 8/15]
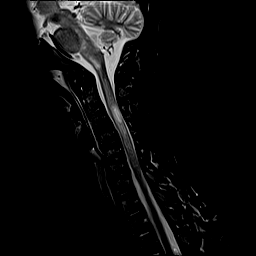
[im 15/15]
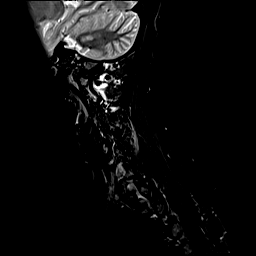

[Series 23: T2 · axial · 3.0mm · 0.66mm/px · z∈[-191,-66]mm · 9 of 43 slices shown (2 of 2)]
[im 1/43]
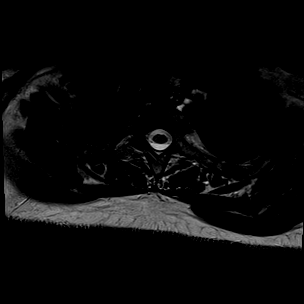
[im 6/43]
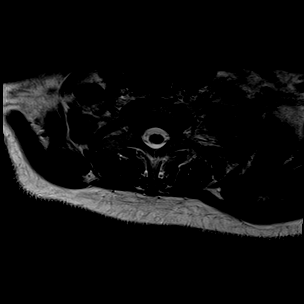
[im 11/43]
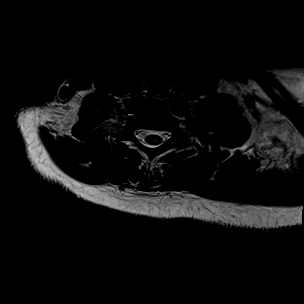
[im 16/43]
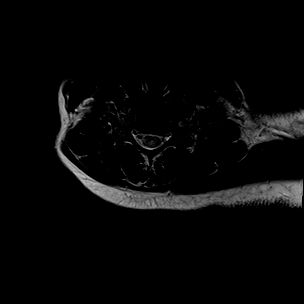
[im 22/43]
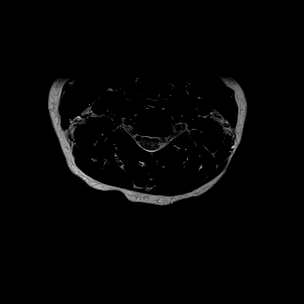
[im 27/43]
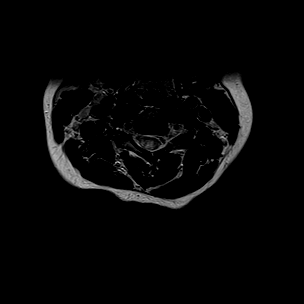
[im 32/43]
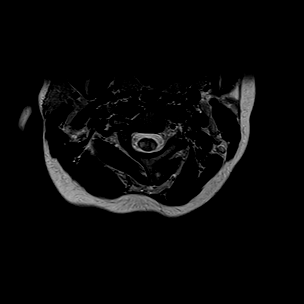
[im 37/43]
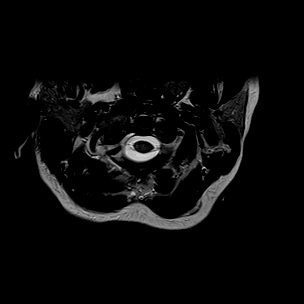
[im 43/43]
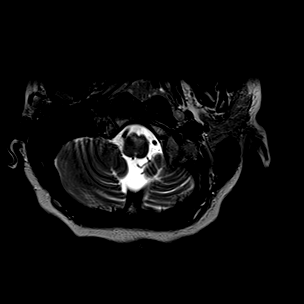

[Series 25: T1 · axial · 3.0mm · 0.35mm/px · z∈[-189,-64]mm · 9 of 43 slices shown (2 of 2)]
[im 1/43]
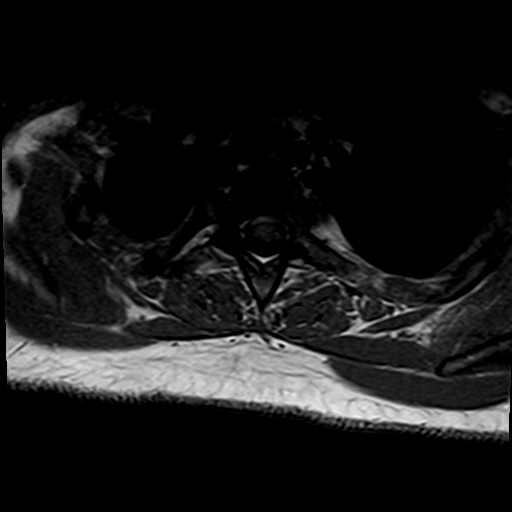
[im 6/43]
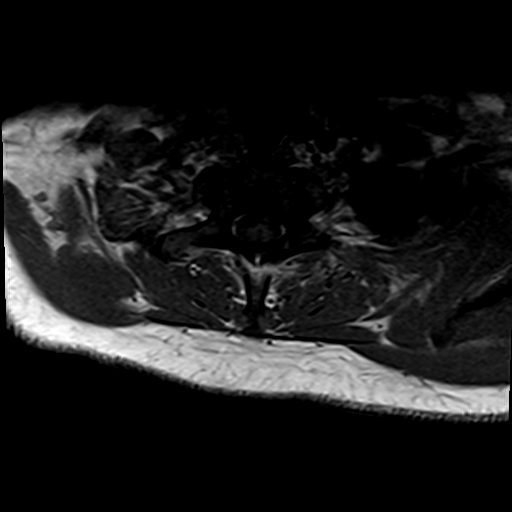
[im 11/43]
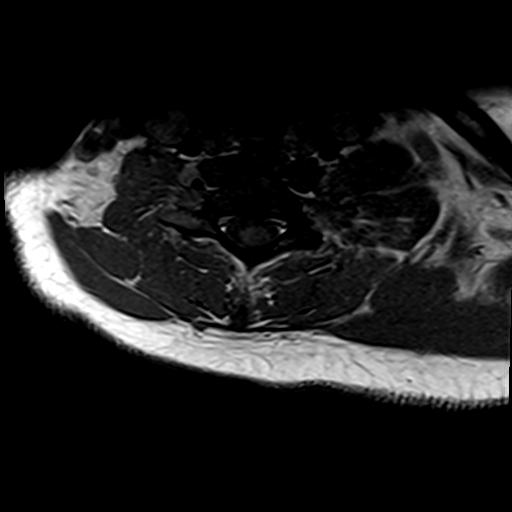
[im 16/43]
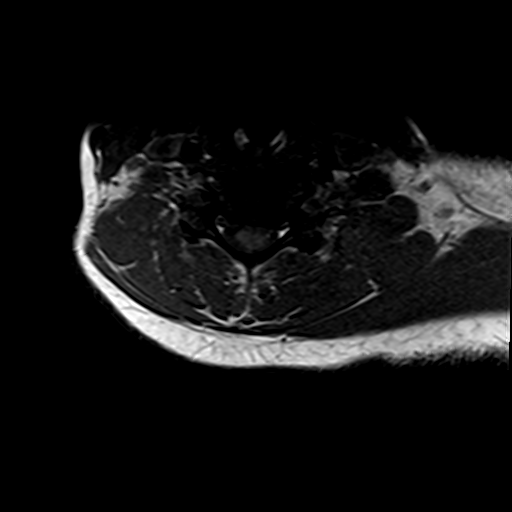
[im 22/43]
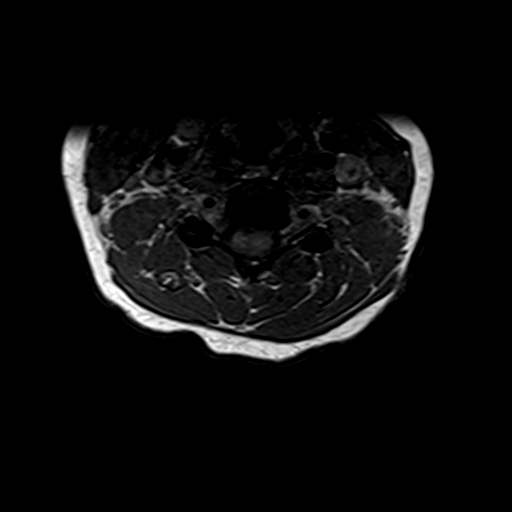
[im 27/43]
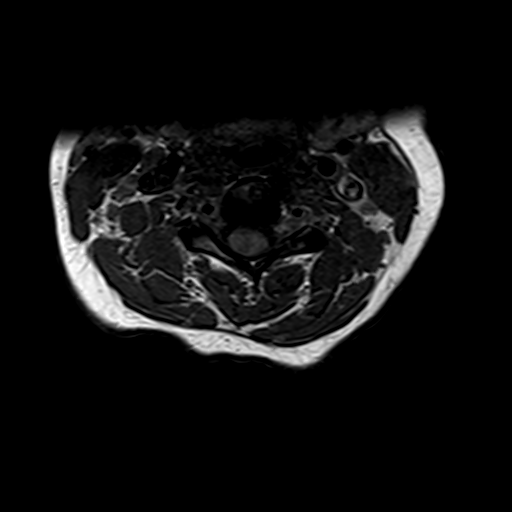
[im 32/43]
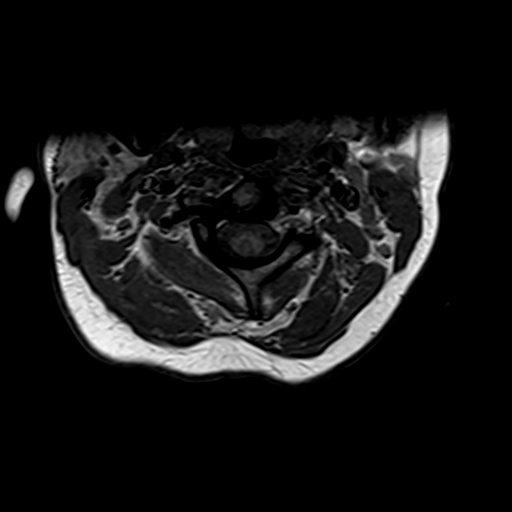
[im 37/43]
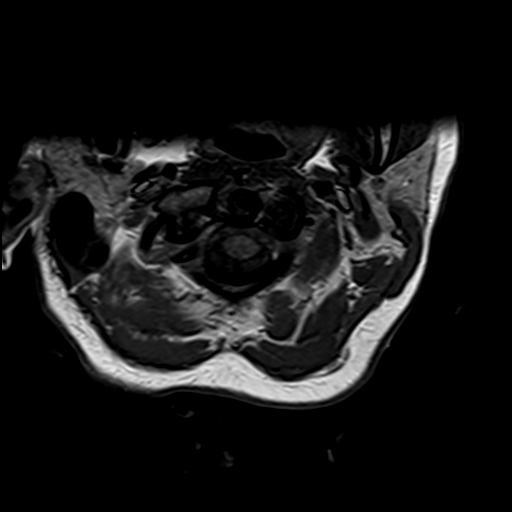
[im 43/43]
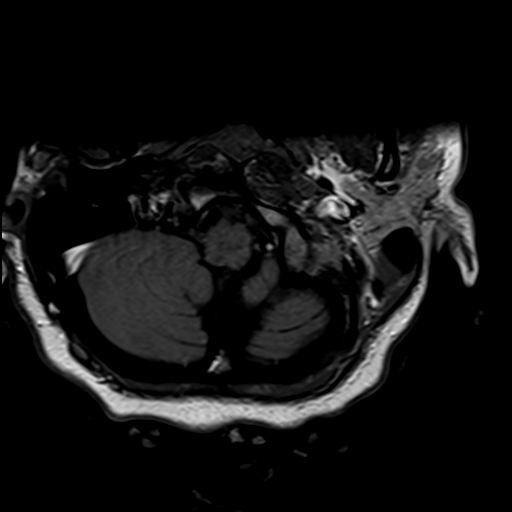

[Series 26: T1 post-contrast · sagittal · 3.0mm · 0.43mm/px · 3 of 15 slices shown]
[im 1/15]
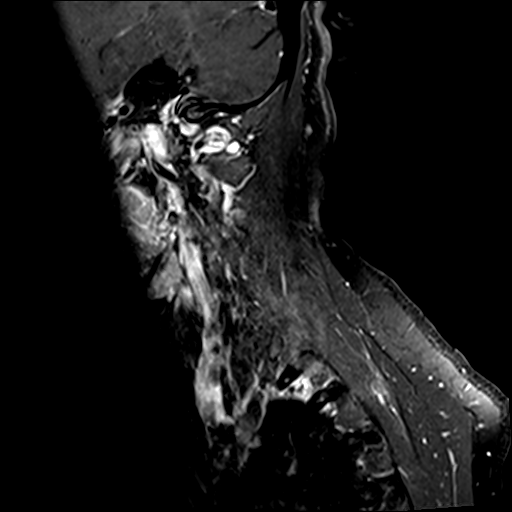
[im 8/15]
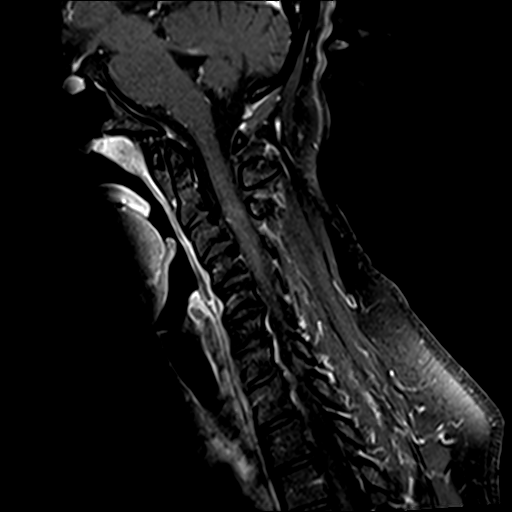
[im 15/15]
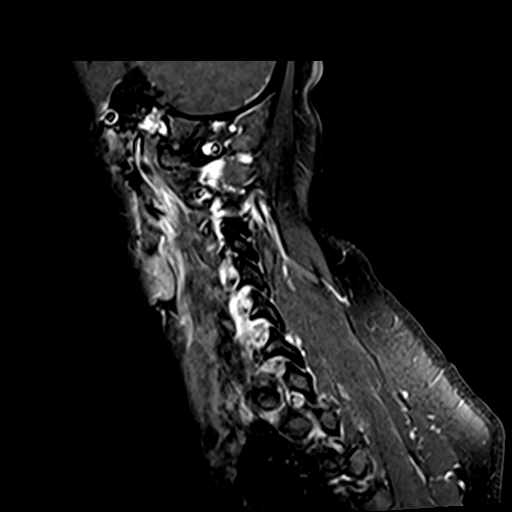

[30 of 48 positions shown; findings below may reference images not displayed]

FINDINGS: Alignment: Straightening with mild reversal of the normal cervical
lordosis. No listhesis.

Vertebrae: Vertebral body height maintained without acute or chronic
fracture. Bone marrow signal intensity somewhat diffusely decreased
on T1 weighted imaging, which could be within normal limits for age.
No discrete or worrisome osseous lesions. No abnormal marrow edema
or enhancement.

Cord: Extensive patchy signal abnormality seen throughout the
cervical spinal cord, extending from the cervicomedullary junction
to approximately C6-7. Involvement is most pronounced at the level
of C3 (series 12, image 8). Patchy enhancement seen about a lesion
at the level of C6-7, consistent with active demyelination (series
26, image 6).

Posterior Fossa, vertebral arteries, paraspinal tissues:
Unremarkable.

Disc levels:

C2-C3: Unremarkable.

C3-C4:  Mild disc bulge.  No canal or foraminal stenosis.

C4-C5: Mild disc bulge with endplate and uncovertebral spurring. No
significant spinal stenosis. Foramina remain patent.

C5-C6: Mild disc bulge with endplate spurring. No significant spinal
stenosis. Left-sided uncovertebral spurring with mild left C6
foraminal narrowing.

C6-C7: Disc bulge with endplate and left-sided uncovertebral
spurring. No spinal stenosis. Mild left C7 foraminal narrowing.

C7-T1:  Unremarkable.
IMPRESSION: 1. Extensive patchy signal abnormality throughout the cervical
spinal cord, consistent with demyelinating disease. Associated
enhancement about a lesion at the level of C6-7 consistent with
active demyelination.
2. Mild for age spinal stenosis at C3-4 through C6-7 without
significant spinal stenosis. Associated mild left C6 and C7
foraminal narrowing.

## 2021-07-25 IMAGING — MR MR THORACIC SPINE WO/W CM
5 of 9 series · 21 of 48 positions shown · IV contrast (gadavist)
Comparison: None available.

CLINICAL DATA: Initial evaluation for demyelinating disease. Lower
extremity weakness.

EXAM:
MRI THORACIC WITHOUT AND WITH CONTRAST
TECHNIQUE: Multiplanar and multiecho pulse sequences of the thoracic spine were
obtained without and with intravenous contrast.
CONTRAST:  9mL GADAVIST GADOBUTROL 1 MMOL/ML IV SOLN

[Series 7: T1 · sagittal · 3.3mm · 0.62mm/px · 1 of 9 slices shown (1 of 3)]
[im 1/9]
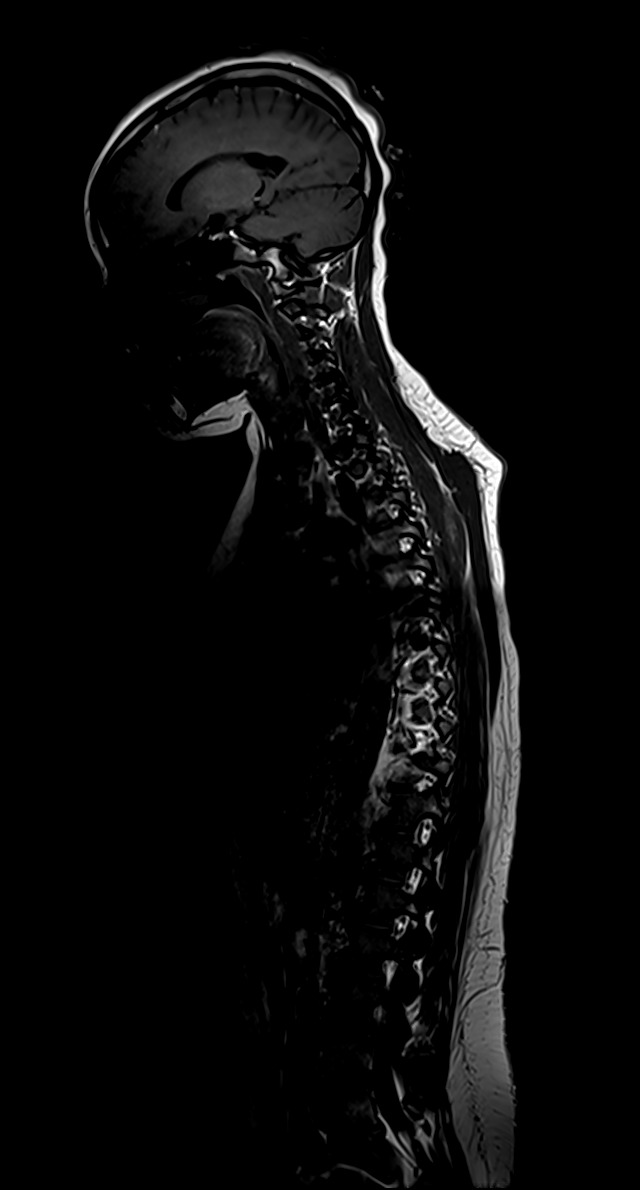

[Series 8: T2 · sagittal · 3.0mm · 0.76mm/px · 3 of 19 slices shown (1 of 2)]
[im 1/19]
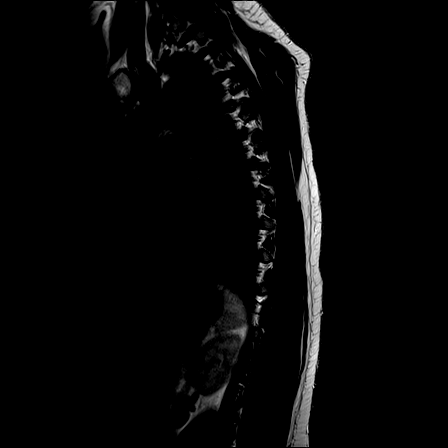
[im 10/19]
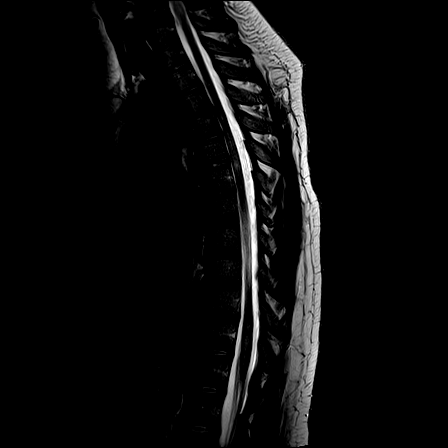
[im 19/19]
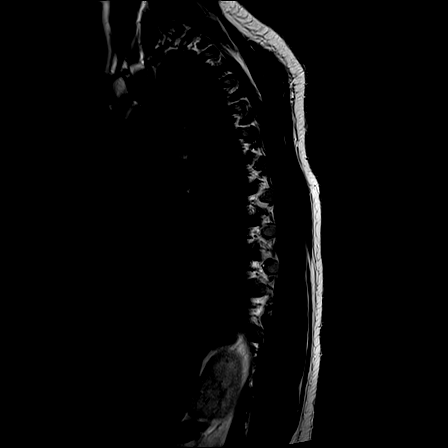

[Series 9: T1 · sagittal · 3.0mm · 0.76mm/px · 4 of 19 slices shown (2 of 3)]
[im 1/19]
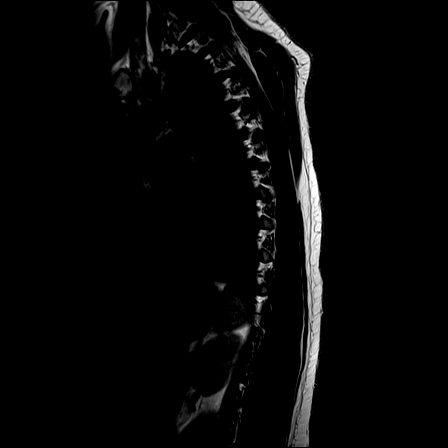
[im 7/19]
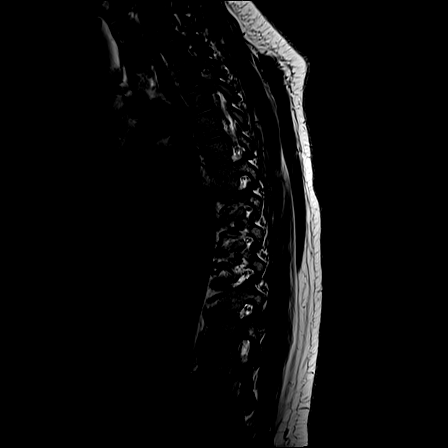
[im 13/19]
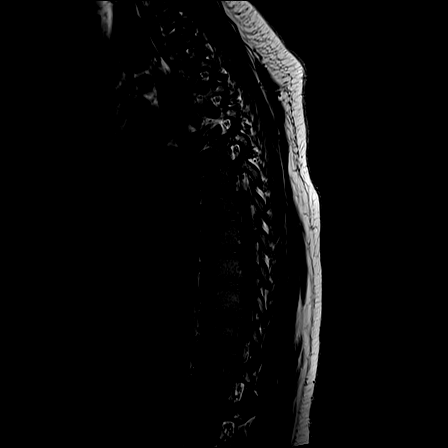
[im 19/19]
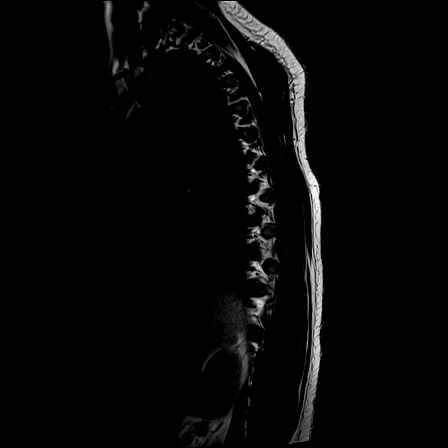

[Series 11: T2 · axial · 5.0mm · 0.59mm/px · z∈[-407,-179]mm · 8 of 39 slices shown (2 of 2)]
[im 1/39]
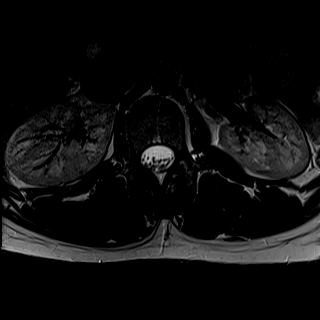
[im 6/39]
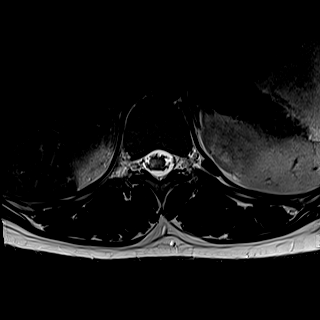
[im 11/39]
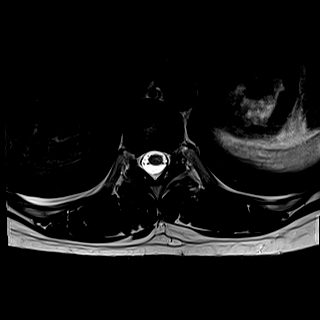
[im 17/39]
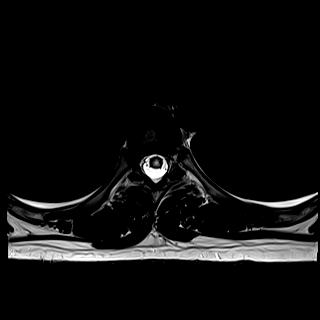
[im 22/39]
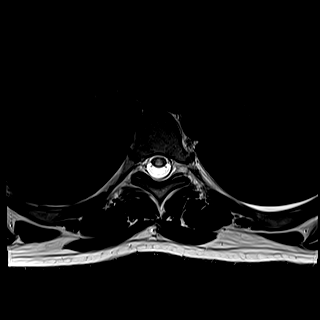
[im 28/39]
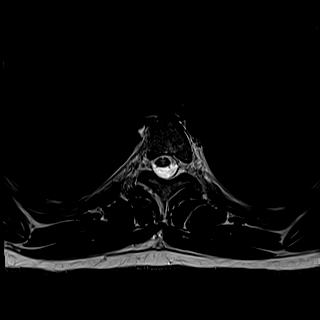
[im 33/39]
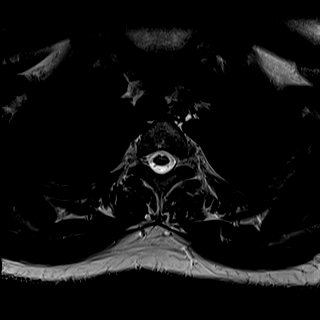
[im 39/39]
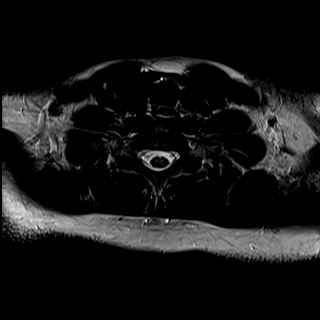

[Series 13: T1 · axial · non-contrast · 5.0mm · 0.31mm/px · z∈[-407,-270]mm · 5 of 39 slices shown (3 of 3)]
[im 1/39]
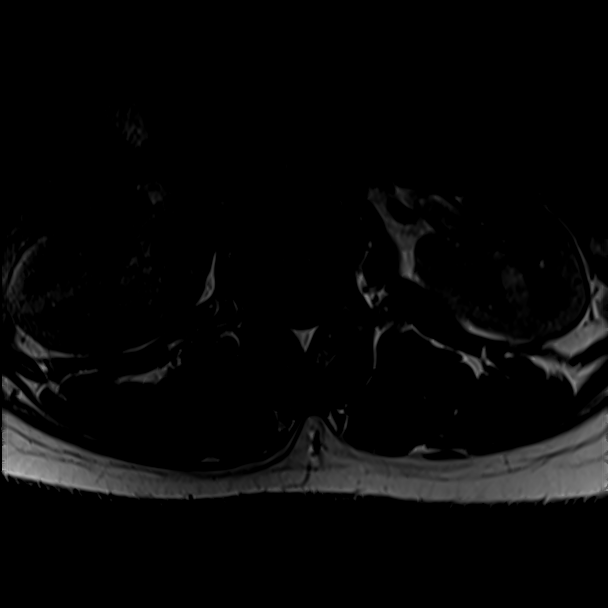
[im 6/39]
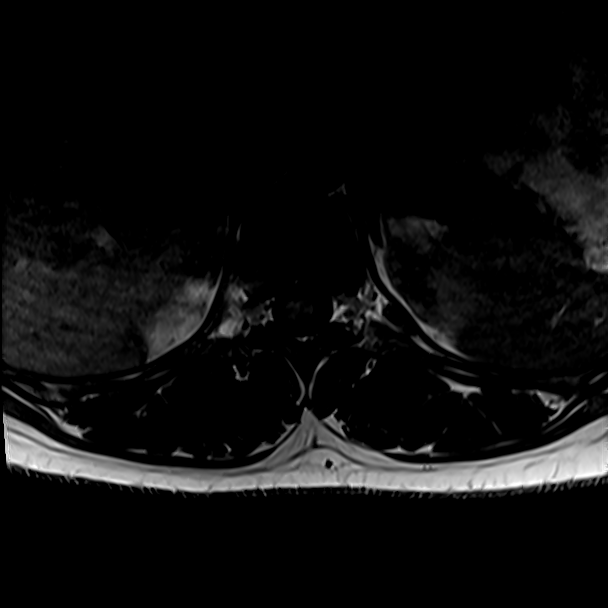
[im 11/39]
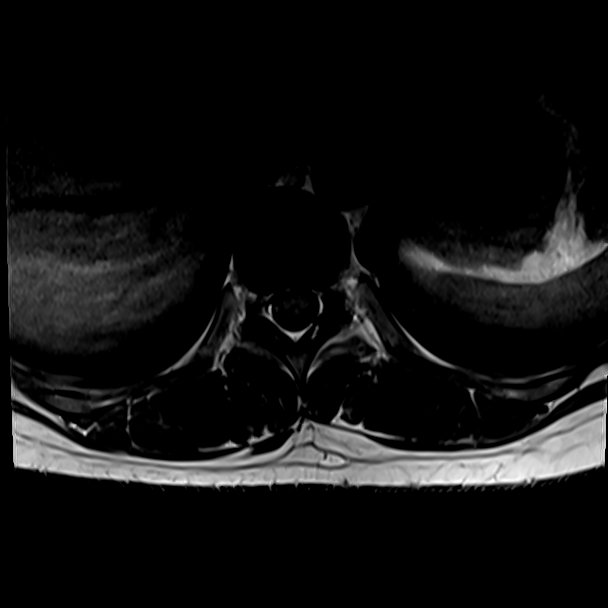
[im 17/39]
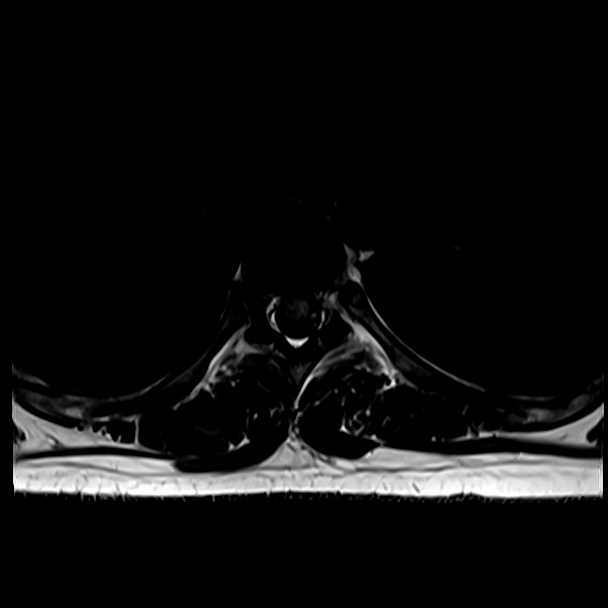
[im 22/39]
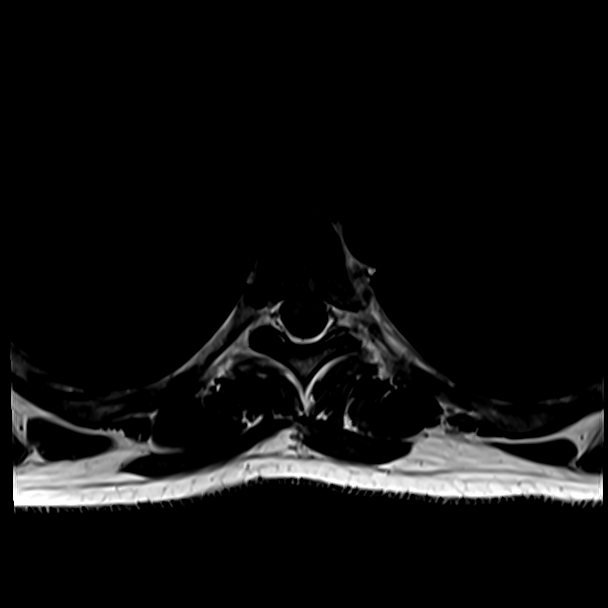

[21 of 48 positions shown; findings below may reference images not displayed]

FINDINGS: Alignment: Mild dextroscoliosis. Alignment otherwise normal with
preservation of the normal thoracic kyphosis. No listhesis.

Vertebrae: Vertebral body height maintained without acute or chronic
fracture. Bone marrow signal intensity diffusely decreased on T1
weighted imaging, nonspecific, but could be within normal limits
given patient age. No discrete or worrisome osseous lesions. No
abnormal marrow edema or enhancement.

Cord: Extensive patchy signal abnormality seen throughout the
majority of the thoracic spinal cord, consistent with demyelinating
disease. Lesions are most pronounced at the levels of T5-6 (series
11, image 17) and T8-9 (series 11, image 25). There is probable
subtle patchy enhancement about the lesion at the level of T8-9
(series 15, image 25) and possibly T7-8 (series 15, image 23),
suspicious for active demyelination.

Paraspinal and other soft tissues: Unremarkable.

Disc levels:

No significant degenerative disc disease seen within the thoracic
spine. Intervertebral discs are well hydrated with preserved disc
height. No significant disc bulge or focal disc herniation. No canal
or foraminal stenosis or evidence for neural impingement.
IMPRESSION: 1. Extensive patchy signal abnormality throughout the majority of
the thoracic spinal cord, consistent with demyelinating disease.
Probable subtle patchy enhancement about the lesions at the levels
T7-8 and T8-9, consistent with active demyelination.
2. No significant degenerative disc disease within the thoracic
spine. No stenosis or neural impingement.

## 2021-07-25 IMAGING — MR MR HEAD WO/W CM
14 of 16 series · 40 of 48 positions shown · IV contrast (gadavist)
Comparison: Brain MRI from [DATE].

CLINICAL DATA: Initial evaluation for demyelinating disease,
worsening ataxia.

EXAM:
MRI HEAD WITHOUT AND WITH CONTRAST
TECHNIQUE: Multiplanar, multiecho pulse sequences of the brain and surrounding
structures were obtained without and with intravenous contrast.
CONTRAST:  9mL GADAVIST GADOBUTROL 1 MMOL/ML IV SOLN

[Series 5: DWI · axial · 3.0mm · 0.88mm/px · z∈[-117,+19]mm · 6 of 100 slices shown (1 of 4)]
[im 1/100]
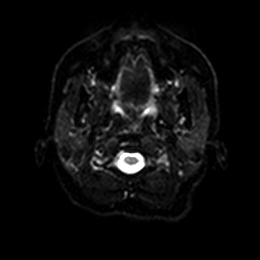
[im 20/100]
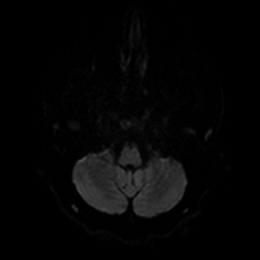
[im 40/100]
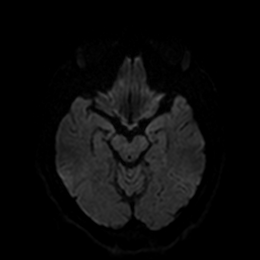
[im 60/100]
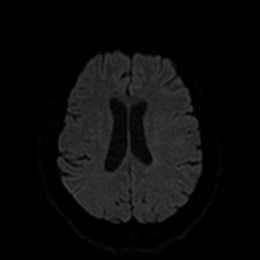
[im 80/100]
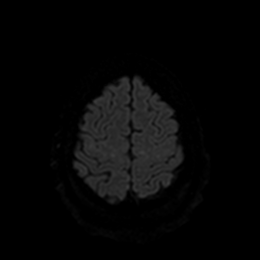
[im 100/100]
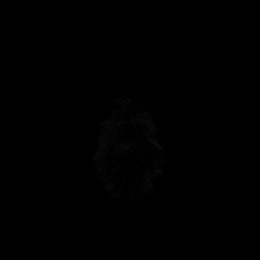

[Series 6: DWI · axial · 3.0mm · 0.88mm/px · z∈[-117,+19]mm · 2 of 50 slices shown (2 of 4)]
[im 1/50]
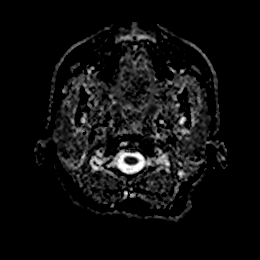
[im 50/50]
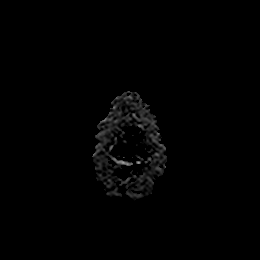

[Series 7: DWI · coronal · 4.0mm · 0.88mm/px · 4 of 68 slices shown (3 of 4)]
[im 1/68]
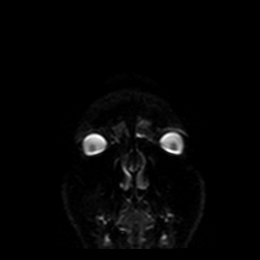
[im 23/68]
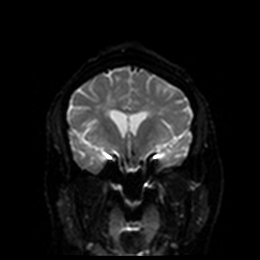
[im 45/68]
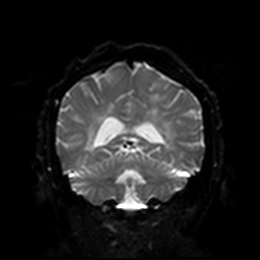
[im 68/68]
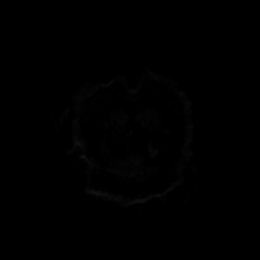

[Series 8: DWI · coronal · 4.0mm · 0.88mm/px · 2 of 34 slices shown (4 of 4)]
[im 1/34]
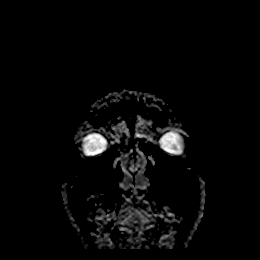
[im 34/34]
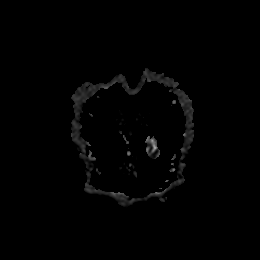

[Series 9: T1 · sagittal · 5.0mm · 0.75mm/px · 2 of 23 slices shown]
[im 1/23]
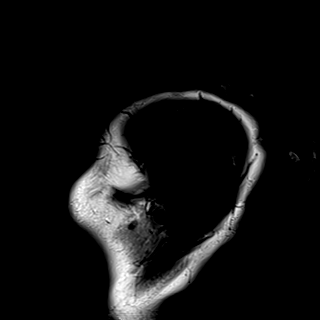
[im 23/23]
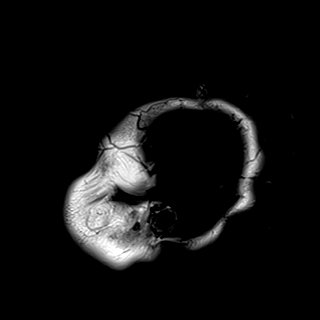

[Series 10: T2 · axial · 5.0mm · 0.72mm/px · z∈[-115,+18]mm · 2 of 25 slices shown]
[im 1/25]
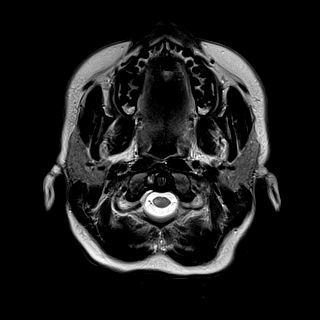
[im 25/25]
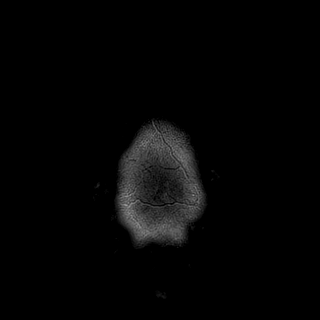

[Series 11: FLAIR · axial · 5.0mm · 0.45mm/px · z∈[-115,+18]mm · 2 of 25 slices shown]
[im 1/25]
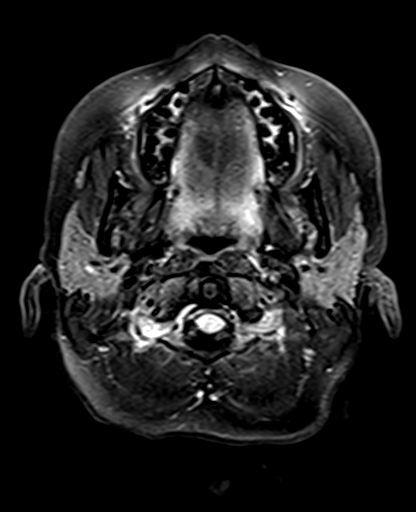
[im 25/25]
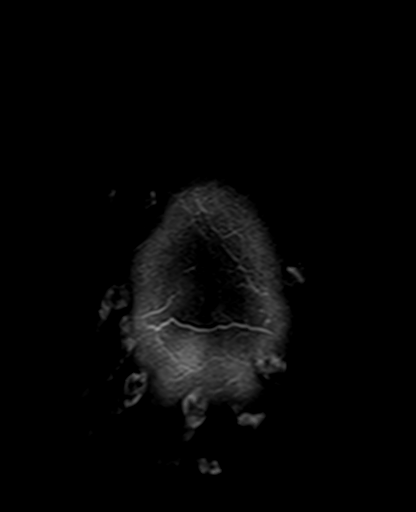

[Series 12: mag_images · axial · 3.0mm · 0.90mm/px · z∈[-119,+22]mm · 4 of 52 slices shown]
[im 1/52]
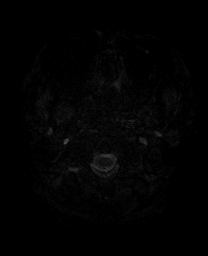
[im 18/52]
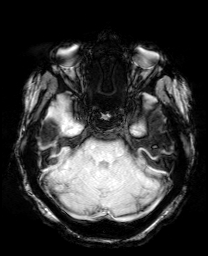
[im 35/52]
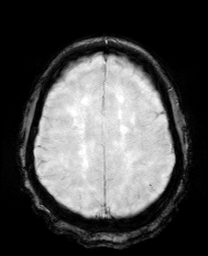
[im 52/52]
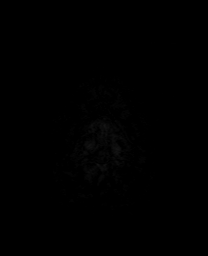

[Series 13: pha_images · axial · 3.0mm · 0.90mm/px · z∈[-119,+22]mm · 3 of 51 slices shown]
[im 1/51]
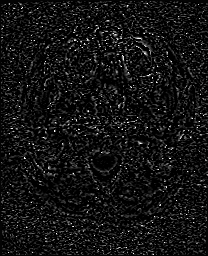
[im 26/51]
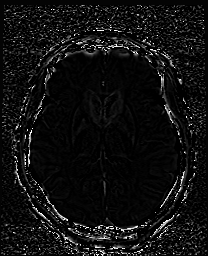
[im 51/51]
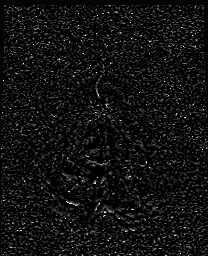

[Series 14: swi_images · axial · 3.0mm · 0.90mm/px · z∈[-119,+22]mm · 4 of 52 slices shown]
[im 1/52]
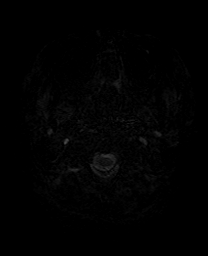
[im 18/52]
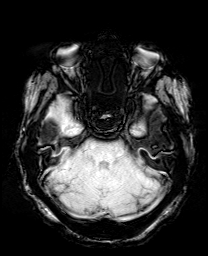
[im 35/52]
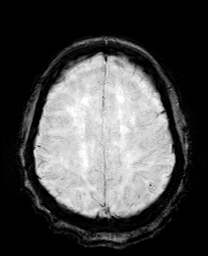
[im 52/52]
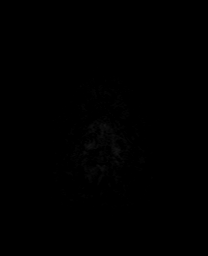

[Series 15: mip_images(sw) · axial · 24.0mm · 0.90mm/px · z∈[-110,+12]mm · 3 of 45 slices shown]
[im 1/45]
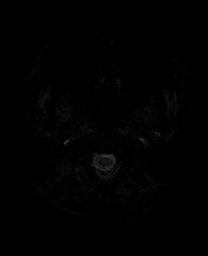
[im 23/45]
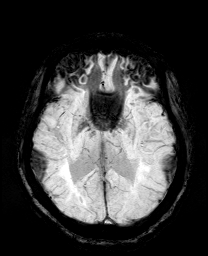
[im 45/45]
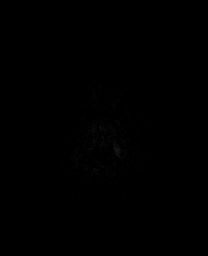

[Series 17: T2 post-contrast · coronal · 5.0mm · 0.72mm/px · 2 of 28 slices shown]
[im 1/28]
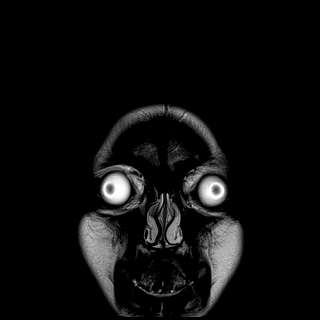
[im 28/28]
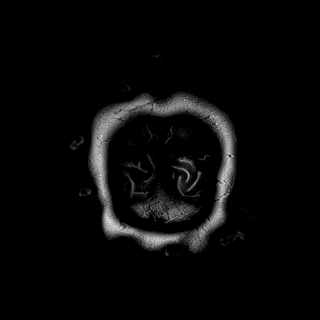

[Series 19: T1 post-contrast · coronal · 5.0mm · 0.34mm/px · 2 of 28 slices shown (1 of 2)]
[im 1/28]
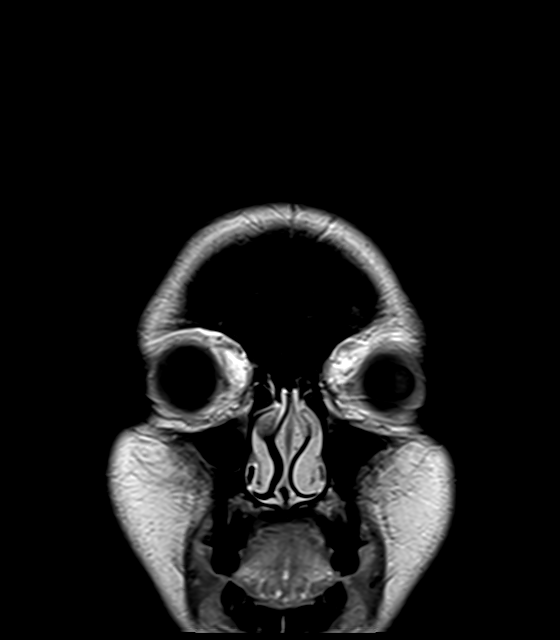
[im 28/28]
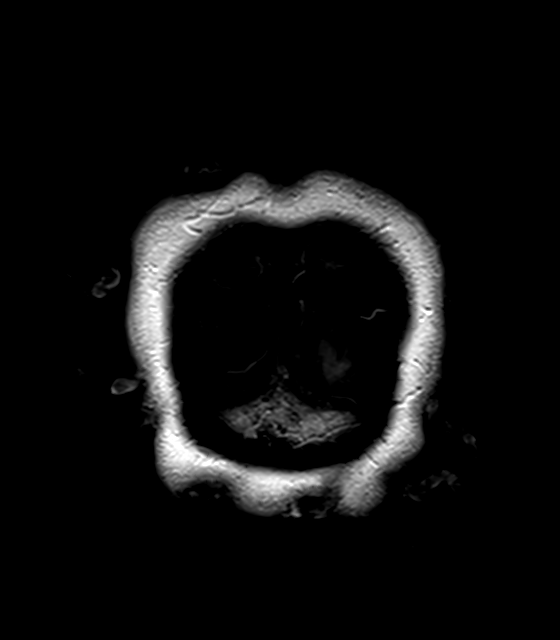

[Series 20: T1 post-contrast · sagittal · 5.0mm · 0.72mm/px · 2 of 23 slices shown (2 of 2)]
[im 1/23]
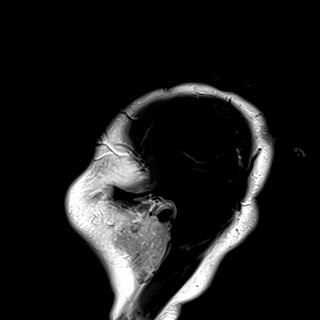
[im 23/23]
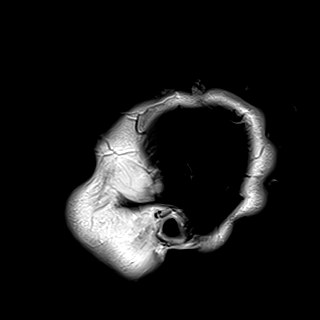

[40 of 48 positions shown; findings below may reference images not displayed]

FINDINGS: Brain: Cerebral volume within normal limits for age. No evidence for
acute or subacute infarct. Again seen is extensive patchy and
confluent T2/FLAIR signal abnormality involving the periventricular,
deep, and juxta cortical white matter both cerebral hemispheres.
Extensive patchy involvement of the deep white matter tracts,
brainstem, and cerebellum. Overall appearance is most concerning for
demyelinating disease. Multiple scattered enhancing lesion seen
involving the bilateral cerebral hemispheres and brainstem,
consistent with active demyelination. For reference purposes the,
the most prominent of these is seen at the right ventral pons and
measures 1 cm (series 18, image 15).

No mass lesion, midline shift, or significant regional mass effect.
No hydrocephalus or extra-axial fluid collection. Pituitary gland
within normal limits for age. Midline structures intact and normal.
No other pathologic enhancement.

Vascular: Major intracranial vascular flow voids are well
maintained.

Skull and upper cervical spine: Craniocervical junction within
normal limits. Bone marrow signal intensity diffusely decreased on
T1 weighted imaging, which could be within normal limits for age. No
focal marrow replacing lesion. No scalp soft tissue abnormality.

Sinuses/Orbits: Globes orbital soft tissues demonstrate no acute
finding. Paranasal sinuses are largely clear. No mastoid effusion.

Other: None.
IMPRESSION: 1. Extensive T2/FLAIR signal abnormality involving the
supratentorial and infratentorial cerebral white matter, most
concerning for demyelinating disease. Multiple scattered enhancing
lesion involving the bilateral cerebral hemispheres and brainstem,
consistent with active demyelination.
2. No other acute intracranial abnormality.

## 2021-07-25 MED ORDER — ACETAMINOPHEN 325 MG PO TABS: 650.0000 mg | ORAL_TABLET | Freq: Four times a day (QID) | ORAL | Status: DC | PRN

## 2021-07-25 MED ORDER — ONDANSETRON HCL 4 MG PO TABS
4.0000 mg | ORAL_TABLET | Freq: Four times a day (QID) | ORAL | Status: DC | PRN
Start: 2021-07-25 — End: 2021-07-30

## 2021-07-25 MED ORDER — ONDANSETRON HCL 4 MG/2ML IJ SOLN: 4.0000 mg | Freq: Four times a day (QID) | INTRAMUSCULAR | Status: DC | PRN

## 2021-07-25 MED ORDER — ACETAMINOPHEN 650 MG RE SUPP: 650.0000 mg | Freq: Four times a day (QID) | RECTAL | Status: DC | PRN

## 2021-07-25 MED ORDER — POTASSIUM CHLORIDE CRYS ER 20 MEQ PO TBCR
40.0000 meq | EXTENDED_RELEASE_TABLET | Freq: Once | ORAL | Status: AC
  Administered 2021-07-25: 40 meq via ORAL
  Filled 2021-07-25: qty 2

## 2021-07-25 MED ORDER — GADOBUTROL 1 MMOL/ML IV SOLN
9.0000 mL | Freq: Once | INTRAVENOUS | Status: AC | PRN
  Administered 2021-07-25: 9 mL via INTRAVENOUS

## 2021-07-25 NOTE — Progress Notes (Signed)
Pt stated she is not currently having suicidal thoughts but has in the past month. She stated at the time she intended to hang herself with a belt.   ?

## 2021-07-25 NOTE — Plan of Care (Signed)
Pt is alert oriented x 4. Pt c/o numbness to bilateral feet and weakness to right lower leg. Pt denies pain. Pt had mac and cheese, applesauce and water for supper. Pt called her mom for update. Pt denies suicidal thoughts at this time.  ? ?Problem: Education: ?Goal: Knowledge of General Education information will improve ?Description: Including pain rating scale, medication(s)/side effects and non-pharmacologic comfort measures ?Outcome: Progressing ?  ?Problem: Health Behavior/Discharge Planning: ?Goal: Ability to manage health-related needs will improve ?Outcome: Progressing ?  ?Problem: Clinical Measurements: ?Goal: Ability to maintain clinical measurements within normal limits will improve ?Outcome: Progressing ?Goal: Will remain free from infection ?Outcome: Progressing ?Goal: Diagnostic test results will improve ?Outcome: Progressing ?Goal: Respiratory complications will improve ?Outcome: Progressing ?Goal: Cardiovascular complication will be avoided ?Outcome: Progressing ?  ?Problem: Activity: ?Goal: Risk for activity intolerance will decrease ?Outcome: Progressing ?  ?Problem: Nutrition: ?Goal: Adequate nutrition will be maintained ?Outcome: Progressing ?  ?Problem: Coping: ?Goal: Level of anxiety will decrease ?Outcome: Progressing ?  ?Problem: Elimination: ?Goal: Will not experience complications related to bowel motility ?Outcome: Progressing ?Goal: Will not experience complications related to urinary retention ?Outcome: Progressing ?  ?Problem: Pain Managment: ?Goal: General experience of comfort will improve ?Outcome: Progressing ?  ?Problem: Safety: ?Goal: Ability to remain free from injury will improve ?Outcome: Progressing ?  ?Problem: Skin Integrity: ?Goal: Risk for impaired skin integrity will decrease ?Outcome: Progressing ?  ?

## 2021-07-25 NOTE — ED Provider Notes (Signed)
?[facility] EMERGENCY DEPARTMENT ?Provider Note ? ? ?CSN: [id number] ?Arrival date & time: 07/25/21  1333 ? ?  ? ?History ? ?Chief Complaint  ?Patient presents with  ? Numbness  ? ? ?Elizabeth Tran is a 21 y.o. female. ? ?Pt is a 21 yo female w/ no significant pmhx.  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  She initially presented to [facility] on 3/11 after eating MJ edibles and felt off balance.  She felt better after fluids and felt better.  However, she began to feel more dizzy and had trouble walking.  Pt said she feels numbness in both legs.  She can't feel to when she needs to urinate.  MRI ordered on 3/23 which showed severe white matter disease concerning for demyelinating disease.  She was seen by Dr. Everlena Cooper today in the office and sent here for further eval.  Pt denies f/c.  No sob or cp. ? ? ?  ? ?Home Medications ?Prior to Admission medications   ?Medication Sig Start Date End Date Taking? Authorizing Provider  ?famotidine (PEPCID) 20 MG tablet Take 20 mg by mouth daily as needed for heartburn or indigestion.    [provider]  ?sertraline (ZOLOFT) 20 MG/ML concentrated solution Take 1.3 mLs (26 mg total) by mouth daily. 01/03/21 02/02/21  Estella Husk, MD  ?   ? ?Allergies    ?Patient has no known allergies.   ? ?Review of Systems   ?Review of Systems  ?Neurological:  Positive for weakness and numbness.  ?     Trouble walking  ?All other systems reviewed and are negative. ? ?Physical Exam ?Updated Vital Signs ?BP 118/79 (BP Location: Left Arm)   Pulse 71   Temp 98.3 ?F (36.8 ?C) (Oral)   Resp 14   LMP  (LMP Unknown)   SpO2 99%  ?Physical Exam ?Vitals and nursing note reviewed.  ?Constitutional:   ?   Appearance: Normal appearance.  ?HENT:  ?   Head: Normocephalic and atraumatic.  ?   Right Ear: External ear normal.  ?   Left Ear: External ear normal.  ?   Nose: Nose normal.  ?   Mouth/Throat:  ?   Mouth: Mucous membranes are moist.  ?   Pharynx: Oropharynx  is clear.  ?Eyes:  ?   Extraocular Movements: Extraocular movements intact.  ?   Conjunctiva/sclera: Conjunctivae normal.  ?   Pupils: Pupils are equal, round, and reactive to light.  ?Cardiovascular:  ?   Rate and Rhythm: Normal rate and regular rhythm.  ?   Pulses: Normal pulses.  ?   Heart sounds: Normal heart sounds.  ?Pulmonary:  ?   Effort: Pulmonary effort is normal.  ?   Breath sounds: Normal breath sounds.  ?Abdominal:  ?   General: Abdomen is flat. Bowel sounds are normal.  ?   Palpations: Abdomen is soft.  ?Musculoskeletal:     ?   General: Normal range of motion.  ?   Cervical back: Normal range of motion and neck supple.  ?Skin: ?   General: Skin is warm.  ?   Capillary Refill: Capillary refill takes less than 2 seconds.  ?Neurological:  ?   Mental Status: She is alert and oriented to person, place, and time.  ?   Comments: Ataxic gait.  She required 2 people to assist her walk to the bathroom.  ?Psychiatric:     ?   Mood and Affect: Mood normal.     ?  Behavior: Behavior normal.  ? ? ?ED Results / Procedures / Treatments   ?Labs ?(all labs ordered are listed, but only abnormal results are displayed) ?Labs Reviewed  ?CBC WITH DIFFERENTIAL/PLATELET - Abnormal; Notable for the following components:  ?    Result Value  ? RBC 3.85 (*)   ? Hemoglobin 11.2 (*)   ? HCT 33.6 (*)   ? All other components within normal limits  ?BASIC METABOLIC PANEL - Abnormal; Notable for the following components:  ? Potassium 3.4 (*)   ? All other components within normal limits  ?URINALYSIS, ROUTINE W REFLEX MICROSCOPIC - Abnormal; Notable for the following components:  ? APPearance HAZY (*)   ? All other components within normal limits  ?I-STAT BETA HCG BLOOD, ED ([facility], [facility], [facility] ONLY)  ? ? ?EKG ?None ? ?Radiology ?No results found. ? ?Procedures ?Procedures  ? ? ?Medications Ordered in ED ?Medications  ?potassium chloride SA (KLOR-CON M) CR tablet 40 mEq (has no administration in time range)  ? ? ?ED Course/ Medical Decision  Making/ A&P ?  ?                        ?Medical Decision Making ?Risk ?Decision regarding hospitalization. ? ? ?This patient presents to the ED for concern of MS, this involves an extensive number of treatment options, and is a complaint that carries with it a high risk of complications and morbidity.  The differential diagnosis includes MS, other demyelinating disease ? ? ?Co morbidities that complicate the patient evaluation ? ?none ? ? ?Additional history obtained: ? ?Additional history obtained from epic chart review ? ? ?Lab Tests: ? ?I Ordered, and personally interpreted labs.  The pertinent results include:  bmp nl, ua nl, cbc nl ? ? ?Imaging Studies ordered: ? ?I reviewed imaging studies including MRI brain w/wo  ?I independently visualized and interpreted imaging which showed IMPRESSION: ?1. No acute intracranial abnormality. ?2. Severe bilateral white matter disease. This may be due to ?demyelinating disease or severe chronic ischemic disease. This ?degree of white matter abnormality is unusual in such a young ?patient. ?I agree with the radiologist interpretation ? ? ? ?Test Considered: ? ?MRI brain/spine ? ? ?Critical Interventions: ? ?Neurology consult ? ? ?Consultations Obtained: ? ?I requested consultation with the neurologist (Dr. Selina Cooley),  and discussed lab and imaging findings as well as pertinent plan - she will see pt and determine if she needs steroids. ?Pt d/w Dr. Elvera Lennox ([facility]) for admission. ? ? ?Problem List / ED Course: ? ?Demyelinating disease on MRI.  This is likely MS.  Further recs per neuro.  Admission to medicine. ? ? ?Reevaluation: ? ?After the interventions noted above, I reevaluated the patient and found that they have :stayed the same ? ? ?Social Determinants of Health: ? ?Senior at [employer] ? ? ?Dispostion: ? ?After consideration of the diagnostic results and the patients response to treatment, I feel that the patent would benefit from admission..   ? ? ? ? ? ? ? ?Final Clinical  Impression(s) / ED Diagnoses ?Final diagnoses:  ?Ataxia  ?Demyelinating disease (HCC)  ? ? ?Rx / DC Orders ?ED Discharge Orders   ? ? None  ? ?  ? ? ?  ?Jacalyn Lefevre, MD ?07/25/21 1717 ? ?

## 2021-07-25 NOTE — ED Provider Triage Note (Signed)
Emergency Medicine Provider Triage Evaluation Note ? ?Elizabeth Tran , a 21 y.o. female  was evaluated in triage.  Pt sent over from neurologist office due to concern for MS flare.  Patient does not have a definitive diagnosis of MS, has been seen by neurology and is currently being worked up for this diagnosis.  Patient is having numbness from bottom of feet all the way up to abdomen.  No other neurodeficits noted.  I contacted the neurologist he stated that he was concerned the patient was having an MS flare, sent over for further evaluation, acute work-up and Solu-Medrol steroids.  Please see neurologist note for further details. ? ?Review of Systems  ?Positive: Bilateral leg numbness, lightheadedness ?Negative: Dizziness, weakness, chest pain, shortness of breath, nausea, vomiting ? ?Physical Exam  ?BP 118/79 (BP Location: Left Arm)   Pulse 71   Temp 98.3 ?F (36.8 ?C) (Oral)   Resp 14   LMP  (LMP Unknown)   SpO2 99%  ?Gen:   Awake, no distress   ?Resp:  Normal effort  ?MSK:   Moves extremities without difficulty  ?Other:  Patient neurological examination: Patient endorses bilateral leg numbness.  No other focal neurodeficits noted. ? ?Medical Decision Making  ?Medically screening exam initiated at 2:03 PM.  Appropriate orders placed.  Jodye Scali was informed that the remainder of the evaluation will be completed by another provider, this initial triage assessment does not replace that evaluation, and the importance of remaining in the ED until their evaluation is complete. ? ? ?  ?Al Decant, PA-C ?07/25/21 1404 ? ?

## 2021-07-25 NOTE — ED Notes (Signed)
Ambulated pt to bathroom w/ 2 assist. Pt gait unsteady and pt w/ shuffling gait.  ?

## 2021-07-25 NOTE — Patient Instructions (Signed)
We will send you over to [facility] for evaluation and possible treatment for multiple sclerosis.  I will have you follow up after discharge to discuss further management ?

## 2021-07-25 NOTE — H&P (Signed)
?History and Physical  ? ? ?Elizabeth Tran [id number] DOB: 2001/03/24 DOA: 07/25/2021 ? ?I have briefly reviewed the patient's prior medical records in [place] ? ?PCP: Roycroft, Sandy Salaam, MD  ?Patient coming from: home ? ?Chief Complaint: Progressive lower extremity weakness and numbness ? ?HPI: Elizabeth Tran is a 21 y.o. female with with medical history of depression / anxiety / bipolar / PTSD, who comes into the hospital with gradual lower extremity weakness, poor balance, and few falls.  This is been going on for the past few weeks.  She has also been experiencing numbness in the legs and in the abdomen area as well as tightness.  She has been getting lightheaded and reports a poor appetite.  She feels like the leg weakness is more on the right side.  She was evaluated as an outpatient, underwent an MRI on 3/23 which showed severe bilateral white matter disease concerning for demyelinating disease or severe chronic ischemic disease.  She was referred urgently to neurology and saw Dr. Tomi Likens today, and he directed her to the ER to be admitted. ? ?ED Course: In the emergency room she is afebrile, normotensive, satting well on room air.  Blood work shows mild hypokalemia and a hemoglobin 11.2 otherwise is unremarkable.  Neurology consulted and we are asked to admit. ? ?Review of Systems: All systems reviewed, and apart from HPI, all negative ? ?History reviewed. No pertinent past medical history. ? ?History reviewed. No pertinent surgical history. ? ?Quit smoking a month ago.  She reports that she does not currently use alcohol. She reports current drug use. Drug: Marijuana. ? ?No Known Allergies ? ?History reviewed. No pertinent family history. ? ?Prior to Admission medications   ?Medication Sig Start Date End Date Taking? Authorizing Provider  ?famotidine (PEPCID) 20 MG tablet Take 20 mg by mouth daily as needed for heartburn or indigestion.    [provider]  ?sertraline (ZOLOFT) 20 MG/ML  concentrated solution Take 1.3 mLs (26 mg total) by mouth daily. 01/03/21 02/02/21  Ival Bible, MD  ? ? ?Physical Exam: ?Vitals:  ? 07/25/21 1337  ?BP: 118/79  ?Pulse: 71  ?Resp: 14  ?Temp: 98.3 ?F (36.8 ?C)  ?TempSrc: Oral  ?SpO2: 99%  ? ?Constitutional: NAD, calm, comfortable ?Eyes: PERRL, lids and conjunctivae normal ?ENMT: Mucous membranes are moist.  ?Neck: normal, supple ?Respiratory: clear to auscultation bilaterally, no wheezing, no crackles.  ?Cardiovascular: Regular rate and rhythm, no murmurs / rubs / gallops. No extremity edema. 2 ?Abdomen: no tenderness, no masses palpated. Bowel sounds positive.  ?Musculoskeletal: no clubbing / cyanosis. Normal muscle tone.  ?Skin: no rashes, lesions, ulcers. No induration ?Neuro: CN2-12 grossly intact, somewhat equal strength, 5-5 RLE ?Psychiatric: Normal judgment and insight. Alert and oriented x 3. Normal mood.  ? ?Labs on Admission: I have personally reviewed following labs and imaging studies ? ?CBC: ?Recent Labs  ?Lab 07/25/21 ?1405  ?WBC 5.3  ?NEUTROABS 3.0  ?HGB 11.2*  ?HCT 33.6*  ?MCV 87.3  ?PLT 253  ? ?Basic Metabolic Panel: ?Recent Labs  ?Lab 07/25/21 ?1405  ?NA 137  ?K 3.4*  ?CL 104  ?CO2 23  ?GLUCOSE 85  ?BUN 8  ?CREATININE 0.64  ?CALCIUM 9.0  ? ?Liver Function Tests: ?No results for input(s): AST, ALT, ALKPHOS, BILITOT, PROT, ALBUMIN in the last 168 hours. ?Coagulation Profile: ?No results for input(s): INR, PROTIME in the last 168 hours. ?BNP (last 3 results) ?No results for input(s): PROBNP in the last 8760 hours. ?CBG: ?No results  for input(s): GLUCAP in the last 168 hours. ?Thyroid Function Tests: ?No results for input(s): TSH, T4TOTAL, FREET4, T3FREE, THYROIDAB in the last 72 hours. ?Urine analysis: ?   ?Component Value Date/Time  ? COLORURINE YELLOW 07/25/2021 1441  ? APPEARANCEUR HAZY (A) 07/25/2021 1441  ? LABSPEC 1.029 07/25/2021 1441  ? PHURINE 6.0 07/25/2021 1441  ? GLUCOSEU NEGATIVE 07/25/2021 1441  ? [place] NEGATIVE 07/25/2021 1441  ?  [place] NEGATIVE 07/25/2021 1441  ? [place] NEGATIVE 07/25/2021 1441  ? PROTEINUR NEGATIVE 07/25/2021 1441  ? NITRITE NEGATIVE 07/25/2021 1441  ? LEUKOCYTESUR NEGATIVE 07/25/2021 1441  ? ? ? ?Radiological Exams on Admission: ?No results found. ? ?EKG: Independently reviewed.  Sinus rhythm ? ?Assessment/Plan ?Principal problem ?MS flare -neurology consulted, discussed with Dr. Quinn Axe.  Will obtain an MRI of the brain, C, T spine with contrast to further evaluate for MS lesions.  Plan to start high-dose IV steroids once MRI confirms MS, +/- potential LP. ? ?Active problems ?Normocytic anemia - obtain anemia panel in am  ? ?Depression / Anxiety / Bipolar -patient tells me she takes Abilify but is not sure about the dose, and another medication that she is not sure about.  Awaiting pharmacy reconciliation before resuming ? ?DVT prophylaxis: SCDs (hold Lovenox due to potential LP)  ?Code Status: Full code  ?Family Communication: no family at bedside  ?Disposition Plan: TBD ?Bed Type: medsurg ?Consults called: Neurology  ?Obs/Inp: Inpatient ? ?At the time of admission, it appears that the appropriate admission status for this patient is INPATIENT as it is expected that patient will require hospital care > 2 midnights. This is judged to be reasonable and necessary in order to provide the required intensity of service to ensure the patient's safety given: presenting symptoms, initial radiographic and laboratory data and in the context of their chronic comorbidities. Together, these circumstances are felt to place patient at high at high risk for further clinical deterioration threatening life, limb, or organ. ? ?Marzetta Board, MD, PhD ?[facility] ? ?Contact via ?[email or web address] ? ?07/25/2021, 5:03 PM  ? ? ? ? ?

## 2021-07-25 NOTE — ED Triage Notes (Addendum)
Patient sent to [facility] from neurology for treatment of what neurologist believes may be an MS flareup, patient not yet diagnosed with MS.  Complains of numbness in bilateral lower extremities. Patient is alert, oriented, and in no apparent distress at this time. ?

## 2021-07-25 NOTE — Consult Note (Addendum)
Neurology Attending Attestation ?  ?I examined the patient and discussed plan with [name] NP. NP note reflects my findings and plan with the following additions/exceptions: ? ?This is a 21 yo woman with hx bipolar disorder well controlled on abilify who is referred by Dr. Tomi Tran for emergent evaluation for sx c/f active demyelination in setting of imaging c/f new dx MS. she does not feel.  She describes noticing that she was leaning towards the right, felt dizzy when she was walking.  She has an ataxic gait.  The last few days she feels that her balance has been getting progressively worse.  She is also noticed new onset numbness in her bilateral lower extremities and her trunk below the navel.  She reports urinary retention for the past few days. She reports that she feels some weakness in her right lower extremity as well and a tightness in her abdomen. ? ?She had an MRI brain with and without contrast on March 23 that showed severe white matter disease in bilateral cerebral hemispheres, right pons, right middle cerebellar peduncle without active enhancement (personal review).  The pattern is concerning for demyelinating disease.  She does not have a prior diagnosis of such.  She was referred by Dr. Tomi Tran to the emergency department for emergent evaluation and potential treatment with solumedrol. ? ? ?Physical Exam ?Gen: A&Ox4, NAD ?HEENT: Atraumatic, normocephalic; oropharynx clear, tongue without atrophy or fasciculations. ?Resp: CTAB, normal work of breathing ?CV: RRR, extremities appear well-perfused. ?Abd: soft/NT/ND ?Extrem: Nml bulk; no cyanosis, clubbing, or edema. ? ?Neuro: ?*MS: A&O x4. Follows multi-step commands.  ?*Speech: no dysarthria or aphasia, able to name and repeat. ?*CN:  ?  I: Deferred ?  II,III: PERRLA, VFF by confrontation, optic discs not visualized 2/2 pupillary constriction ?  III,IV,VI: EOMI w/o nystagmus, no ptosis ?  V: Sensation intact from V1 to V3 to LT ?  VII: Eyelid closure was  full.  Smile symmetric. ?  VIII: Hearing intact to voice ?  IX,X: Voice normal, palate elevates symmetrically  ?  XI: SCM/trap 5/5 bilat   ?XII: Tongue protrudes midline, no atrophy or fasciculations  ?*Motor:   Normal bulk.  No tremor, rigidity or bradykinesia. No pronator drift. 5/5 strength throughout except 4+/5 L grip. ?*Sensory: Diffusely impaired to PP BLE w/ sensory level at approx T6 ?*Coordination:  Dysmetria R FNF and ataxia R HTS ?*Reflexes:  3+ throughout except 4+ bilat achilles (sustained clonus R, unsustained on L). Toes down bilat. ?*Gait: deferred ? ?A/P: ? ?MRI brain highly suspicious for demyelinating disease, likely MS. This would be new dx for her ?History and examination c/f active demyelination in spinal cord ?Will order MRI c and t spine wwo contrast and add repeat MRI brain wwo contrast given progression of sx since scan on 3/23 ?If abnl contrast enhancement suggestive of active demyelination, will start high dose steroids at that time 1g solumedrol IV q 24 hrs ?If no e/o active demyelination on MRI this evening, hold steroids and consider LP eval for alternative dx ?PT/OT ?Will continue to follow ? ?  ?Elizabeth Monks, MD ?[facility] ?[phone] ?  ?If 7pm- 7am, please page neurology on call as listed in [place]. ? ? ?Neurology Consult H&P ? ?Elizabeth Tran ?MR# [id number] ?07/25/2021 ? ? ?CC: 21 year old AA female patient presented to [facility] ED from outpatient neurology office after an abnormal MRI brain. She does endorse 2 weeks of progressive fatigue, belly button to toes numbness, unsteadiness when standing, difficulty walking, changes in  urination habits and dizziness. MRI brain shows extensive white matter lesions bilat cerebral hemispheres, R pons, R cerebellum without contrast enhancement. ? ?ROS: A complete ROS was performed and is negative except as noted in the HPI.   ? ?History reviewed. No pertinent past medical history. ? ? ?History reviewed. No pertinent family  history. ? ?Social History:  reports that she has never smoked. She has never been exposed to tobacco smoke. She has never used smokeless tobacco. She reports that she does not currently use alcohol. She reports current drug use. Drug: Marijuana. ? ? ?Prior to Admission medications   ?Medication Sig Start Date End Date Taking? Authorizing Provider  ?famotidine (PEPCID) 20 MG tablet Take 20 mg by mouth daily as needed for heartburn or indigestion.    [provider]  ?sertraline (ZOLOFT) 20 MG/ML concentrated solution Take 1.3 mLs (26 mg total) by mouth daily. 01/03/21 02/02/21  Ival Bible, MD  ? ? ?Exam: ?Current vital signs: ?BP 118/79 (BP Location: Left Arm)   Pulse 71   Temp 98.3 ?F (36.8 ?C) (Oral)   Resp 14   LMP  (LMP Unknown)   SpO2 99%  ? ?Physical Exam  ?Constitutional: Appears well-developed and well-nourished.  ?Psych: Affect appropriate to situation ?Eyes: No scleral injection ?HENT: No OP obstruction. ?Head: Normocephalic.  ?Cardiovascular: Normal rate and regular rhythm.  ?Respiratory: Effort normal, symmetric excursions bilaterally, no audible wheezing. ?GI: Soft.  No distension. There is no tenderness.  ?Skin: WDI ? ?Neuro: ?Mental Status: ?Patient is awake, alert, oriented to person, place, month, year, and situation. ?Patient is able to give a clear and coherent history. ?Speech is fluent, intact comprehension and repetition. ?No signs of aphasia or neglect. ?Visual Fields are full. Pupils are equal, round, and reactive to light. ?EOMI without ptosis or diploplia.  ?Facial sensation is symmetric to temperature ?Facial movement is symmetric.  ?Hearing is intact to voice. ?Uvula midline and palate elevates symmetrically. ?Shoulder shrug is symmetric. ?Tongue is midline without atrophy or fasciculations. Mild tremor noted ?Tone is normal. Bulk is normal. 4+/5 strength was present in all four extremities. ?Sensation is is decreased from T6- down to tips of toes ?Deep Tendon  Reflexes: 4+ throughout ?Toes are downgoing bilaterally. ?Non sustained clonus Left ankle and Sustained Right foot clonus ?FNF on right sided and HKS bilaterally are impaired R>L ?Gait - Deferred ? ?I have reviewed labs in epic and the pertinent results are: ?Potassium- 3.4, Hgb- 11.2 Hct- 33.6 ? ?I have reviewed the images obtained: ?MRI Brain W/WO 07/17/21 head showed white matter lesions, possible T1 black hole and likely lesion in spinal cord.  ? ?Assessment: Elizabeth Tran is a 21 y.o. female PMHx with no pertinent PMHx. She does endorse 2 weeks of progressive fatigue, belly button to toes numbness, unsteadiness when standing, difficulty walking, changes in urination habits and dizziness. Son exam today she is numb T6 and below, mild Right arm dysmetria on FTN, Ataxia R>L with HKS and sustained Right ankle clonus and non sustained Left heel clonus. Neurology will follow recommended imaging and will consider LP, steroid treatment and/or further testing depending on MRI results. ? ?Impression:  ?Likely MS Flare in 21 year old AA female with two week hx of progressive fatigue, belly button to toes numbness, unsteadiness when standing, difficulty walking, changes in urination habits and dizziness with MRI brain showing multiple white matter lesions  ? ?Plan: ?- Repeat MRI brain W/WO contrast. ?- MRI C&T spine W/WO contrast ?- Recommend PT/OT/SLP consult. ?- May  consider starting steroids, getting LP depending on MRI results ? ? ?This patient is critically ill and at significant risk of neurological worsening, death and care requires constant monitoring of vital signs, hemodynamics,respiratory and cardiac monitoring, neurological assessment, discussion with family, other specialists and medical decision making of high complexity. I spent 50 minutes of neurocritical care time  in the care of  this patient.  ? ?Electronically signed by:  ?Parke Poisson, Neuro NP  ?Available via Epic  ?07/25/2021, 5:10 PM ? ?If 7pm-  7am, please page neurology on call as listed in [place].  ?

## 2021-07-25 NOTE — Progress Notes (Signed)
On call Provider Garing, MD informed that pt is scoring High risk on Suicide/ self harm risk due to having suicide thoughts in the past month. Pt states she does not currently have suicidal thoughts. Pt states she has been seeing a therapist and psychiatrist. NO new orders received but monitor pt and assess [place] suicide scale daily and as needed.  ?

## 2021-07-25 NOTE — ED Notes (Signed)
Pt transported to MRI at this time 

## 2021-07-26 DIAGNOSIS — R42 Dizziness and giddiness: Secondary | ICD-10-CM | POA: Diagnosis not present

## 2021-07-26 DIAGNOSIS — F32A Depression, unspecified: Secondary | ICD-10-CM

## 2021-07-26 DIAGNOSIS — G35 Multiple sclerosis: Secondary | ICD-10-CM | POA: Diagnosis not present

## 2021-07-26 DIAGNOSIS — R200 Anesthesia of skin: Secondary | ICD-10-CM | POA: Diagnosis not present

## 2021-07-26 DIAGNOSIS — R270 Ataxia, unspecified: Secondary | ICD-10-CM | POA: Diagnosis not present

## 2021-07-26 DIAGNOSIS — G379 Demyelinating disease of central nervous system, unspecified: Secondary | ICD-10-CM | POA: Diagnosis not present

## 2021-07-26 DIAGNOSIS — F322 Major depressive disorder, single episode, severe without psychotic features: Secondary | ICD-10-CM | POA: Diagnosis not present

## 2021-07-26 LAB — CBC
HCT: 30.5 % — ABNORMAL LOW (ref 36.0–46.0)
Hemoglobin: 10.3 g/dL — ABNORMAL LOW (ref 12.0–15.0)
MCH: 29.1 pg (ref 26.0–34.0)
MCHC: 33.8 g/dL (ref 30.0–36.0)
MCV: 86.2 fL (ref 80.0–100.0)
Platelets: 221 10*3/uL (ref 150–400)
RBC: 3.54 MIL/uL — ABNORMAL LOW (ref 3.87–5.11)
RDW: 13 % (ref 11.5–15.5)
WBC: 5.5 10*3/uL (ref 4.0–10.5)
nRBC: 0 % (ref 0.0–0.2)

## 2021-07-26 LAB — VITAMIN B12: Vitamin B-12: 544 pg/mL (ref 180–914)

## 2021-07-26 LAB — COMPREHENSIVE METABOLIC PANEL
ALT: 10 U/L (ref 0–44)
AST: 16 U/L (ref 15–41)
Albumin: 3.6 g/dL (ref 3.5–5.0)
Alkaline Phosphatase: 42 U/L (ref 38–126)
Anion gap: 5 (ref 5–15)
BUN: 10 mg/dL (ref 6–20)
CO2: 25 mmol/L (ref 22–32)
Calcium: 9.2 mg/dL (ref 8.9–10.3)
Chloride: 108 mmol/L (ref 98–111)
Creatinine, Ser: 0.63 mg/dL (ref 0.44–1.00)
GFR, Estimated: >60 mL/min (ref >60)
Glucose, Bld: 87 mg/dL (ref 70–99)
Potassium: 3.9 mmol/L (ref 3.5–5.1)
Sodium: 138 mmol/L (ref 135–145)
Total Bilirubin: 0.6 mg/dL (ref 0.3–1.2)
Total Protein: 6.6 g/dL (ref 6.5–8.1)

## 2021-07-26 LAB — IRON AND TIBC
Iron: 100 ug/dL (ref 28–170)
Saturation Ratios: 26 % (ref 10.4–31.8)
TIBC: 389 ug/dL (ref 250–450)
UIBC: 289 ug/dL

## 2021-07-26 LAB — FERRITIN: Ferritin: 6 ng/mL — ABNORMAL LOW (ref 11–307)

## 2021-07-26 LAB — RETICULOCYTES
Immature Retic Fract: 8.8 % (ref 2.3–15.9)
RBC.: 3.51 MIL/uL — ABNORMAL LOW (ref 3.87–5.11)
Retic Count, Absolute: 32.3 10*3/uL (ref 19.0–186.0)
Retic Ct Pct: 0.9 % (ref 0.4–3.1)

## 2021-07-26 LAB — HEPATITIS PANEL, ACUTE
HCV Ab: NONREACTIVE
Hep A IgM: NONREACTIVE
Hep B C IgM: NONREACTIVE
Hepatitis B Surface Ag: NONREACTIVE

## 2021-07-26 LAB — PROTIME-INR
INR: 1.2 (ref 0.8–1.2)
Prothrombin Time: 14.7 seconds (ref 11.4–15.2)

## 2021-07-26 LAB — TSH: TSH: 1.605 u[IU]/mL (ref 0.350–4.500)

## 2021-07-26 LAB — VITAMIN D 25 HYDROXY (VIT D DEFICIENCY, FRACTURES): Vit D, 25-Hydroxy: 26.08 ng/mL — ABNORMAL LOW (ref 30–100)

## 2021-07-26 LAB — HIV ANTIBODY (ROUTINE TESTING W REFLEX): HIV Screen 4th Generation wRfx: NONREACTIVE

## 2021-07-26 LAB — FOLATE: Folate: 14.7 ng/mL (ref >5.9)

## 2021-07-26 MED ORDER — PANTOPRAZOLE SODIUM 40 MG IV SOLR
40.0000 mg | INTRAVENOUS | Status: AC
  Administered 2021-07-26 – 2021-07-30 (×5): 40 mg via INTRAVENOUS
  Filled 2021-07-26 (×5): qty 10

## 2021-07-26 MED ORDER — SENNOSIDES-DOCUSATE SODIUM 8.6-50 MG PO TABS
1.0000 | ORAL_TABLET | Freq: Every day | ORAL | Status: DC
  Administered 2021-07-26 – 2021-07-30 (×5): 1 via ORAL
  Filled 2021-07-26 (×5): qty 1

## 2021-07-26 MED ORDER — ACETAMINOPHEN 80 MG PO CHEW: 500.0000 mg | CHEWABLE_TABLET | Freq: Four times a day (QID) | ORAL | Status: DC | PRN

## 2021-07-26 MED ORDER — SODIUM CHLORIDE 0.9 % IV SOLN
1000.0000 mg | INTRAVENOUS | Status: AC
  Administered 2021-07-26 – 2021-07-30 (×5): 1000 mg via INTRAVENOUS
  Filled 2021-07-26 (×5): qty 16

## 2021-07-26 MED ORDER — MECLIZINE HCL 12.5 MG PO TABS: 25.0000 mg | ORAL_TABLET | Freq: Three times a day (TID) | ORAL | Status: DC | PRN

## 2021-07-26 MED ORDER — FLUOXETINE HCL 10 MG PO CAPS
10.0000 mg | ORAL_CAPSULE | Freq: Every day | ORAL | Status: DC
Start: 2021-07-26 — End: 2021-07-30
  Administered 2021-07-26 – 2021-07-29 (×4): 10 mg via ORAL
  Filled 2021-07-26 (×5): qty 1

## 2021-07-26 MED ORDER — ACETAMINOPHEN 160 MG/5ML PO SOLN
500.0000 mg | Freq: Four times a day (QID) | ORAL | Status: DC | PRN
  Administered 2021-07-26: 500 mg via ORAL
  Filled 2021-07-26: qty 20.3

## 2021-07-26 MED ORDER — ARIPIPRAZOLE 10 MG PO TABS
10.0000 mg | ORAL_TABLET | Freq: Every day | ORAL | Status: DC
  Administered 2021-07-26 – 2021-07-30 (×5): 10 mg via ORAL
  Filled 2021-07-26 (×5): qty 1

## 2021-07-26 MED ORDER — FERROUS SULFATE 300 (60 FE) MG/5ML PO SYRP
220.0000 mg | ORAL_SOLUTION | Freq: Every day | ORAL | Status: DC
  Administered 2021-07-26 – 2021-07-30 (×5): 220 mg via ORAL
  Filled 2021-07-26 (×6): qty 5

## 2021-07-26 NOTE — Progress Notes (Signed)
?PROGRESS NOTE ? ?Elizabeth Tran [id number] DOB: July 26, 2000 DOA: 07/25/2021 ?PCP: Roycroft, Barbette Hair, MD ? ? LOS: 1 day  ? ?Brief Narrative / Interim history: ?21 year old female with history of depression, anxiety, bipolar disorder who comes to the hospital with gradual lower extremity weakness, poor balance, few falls, going on for several weeks but progressively getting worse.  She also has been experience numbness in her legs and abdomen.  She was evaluated as an outpatient, and there was concern for multiple sclerosis and sent for admission.  Neurology consulted.  Imaging in the ED confirmed MS diagnosis and she was started on high-dose steroids ? ?Subjective / 24h Interval events: ?Feeling about the same this morning.  No chest pain, no shortness of breath, no nausea or vomiting. ? ?Assesement and Plan: ?Principal Problem: ?  Multiple sclerosis (HCC) ?Active Problems: ?  Normocytic anemia ?  Hypokalemia ?  Depression ? ?Assessment and Plan: ?Principal problem ?MS flare-neurology consulted, imaging consistent with this diagnosis.  Started on high-dose steroids for 5 days, today's day #1.  Monitor neuro progress.  PT consulted ? ?Active problems ?Normocytic anemia, iron deficiency anemia-patient states that she is on liquid iron at home.  Start that here. ? ?Depression/anxiety/bipolar-she reports recent suicidal ideation but none currently.  Resume home medications.  She does appear to have a very flat affect.  She tells me that her anxiety is getting worse given recent diagnosis.  Psychiatry consulted, appreciate input. ? ? ?Scheduled Meds: ? ARIPiprazole  10 mg Oral Daily  ? FLUoxetine  10 mg Oral QHS  ? pantoprazole (PROTONIX) IV  40 mg Intravenous Q24H  ? ?Continuous Infusions: ? methylPREDNISolone (SOLU-MEDROL) injection    ? ?PRN Meds:.acetaminophen **OR** acetaminophen, ondansetron **OR** ondansetron (ZOFRAN) IV ? ?Diet Orders (From admission, onward)  ? ?  Start     Ordered  ? 07/25/21 1852  Diet  regular Room service appropriate? Yes; Fluid consistency: Thin  Diet effective now       ?Question Answer Comment  ?Room service appropriate? Yes   ?Fluid consistency: Thin   ?  ? 07/25/21 1851  ? ?  ?  ? ?  ? ? ?DVT prophylaxis: SCDs Start: 07/25/21 1852 ? ? ?Lab Results  ?Component Value Date  ? PLT 221 07/26/2021  ? ? ?  Code Status: Full Code ? ?Family Communication: No family at bedside ? ?Status is: Inpatient ? ?Remains inpatient appropriate because: IV Solu-Medrol, severity of illness ? ? ?Level of care: Med-Surg ? ?Consultants:  ?Neurology ? ?Procedures:  ?none ? ?Microbiology  ?none ? ?Antimicrobials: ?none  ? ? ?Objective: ?Vitals:  ? 07/25/21 2104 07/25/21 2317 07/26/21 0346 07/26/21 0752  ?BP: 115/77 118/74 103/78 110/66  ?Pulse: 84 77 66 79  ?Resp:  18  16  ?Temp: 98.2 ?F (36.8 ?C) 98.7 ?F (37.1 ?C) 98.5 ?F (36.9 ?C) 98.1 ?F (36.7 ?C)  ?TempSrc: Oral Oral Oral Oral  ?SpO2: 100% 97% 100% 100%  ? ? ?Intake/Output Summary (Last 24 hours) at 07/26/2021 0955 ?Last data filed at 07/26/2021 0900 ?Gross per 24 hour  ?Intake 720 ml  ?Output --  ?Net 720 ml  ? ?Wt Readings from Last 3 Encounters:  ?07/25/21 86.6 kg  ? ? ?Examination: ? ?Constitutional: NAD ?Eyes: no scleral icterus ?ENMT: Mucous membranes are moist.  ?Neck: normal, supple ?Respiratory: clear to auscultation bilaterally, no wheezing, no crackles. Normal respiratory effort.  ?Cardiovascular: Regular rate and rhythm, no murmurs / rubs / gallops. No LE edema.  ?Abdomen: non distended, no tenderness.  Bowel sounds positive.  ?Musculoskeletal: no clubbing / cyanosis.  ?Skin: no rashes ? ? ?Data Reviewed: I have independently reviewed following labs and imaging studies  ? ?CBC ?Recent Labs  ?Lab 07/25/21 ?1405 07/26/21 ?0227  ?WBC 5.3 5.5  ?HGB 11.2* 10.3*  ?HCT 33.6* 30.5*  ?PLT 253 221  ?MCV 87.3 86.2  ?MCH 29.1 29.1  ?MCHC 33.3 33.8  ?RDW 13.0 13.0  ?LYMPHSABS 1.5  --   ?MONOABS 0.5  --   ?EOSABS 0.3  --   ?BASOSABS 0.0  --   ? ? ?Recent Labs  ?Lab  07/25/21 ?1405 07/26/21 ?0227  ?NA 137 138  ?K 3.4* 3.9  ?CL 104 108  ?CO2 23 25  ?GLUCOSE 85 87  ?BUN 8 10  ?CREATININE 0.64 0.63  ?CALCIUM 9.0 9.2  ?AST  --  16  ?ALT  --  10  ?ALKPHOS  --  42  ?BILITOT  --  0.6  ?ALBUMIN  --  3.6  ?INR  --  1.2  ?TSH  --  1.605  ? ? ?------------------------------------------------------------------------------------------------------------------ ?No results for input(s): CHOL, HDL, LDLCALC, TRIG, CHOLHDL, LDLDIRECT in the last 72 hours. ? ?Lab Results  ?Component Value Date  ? HGBA1C 5.5 12/30/2020  ? ?------------------------------------------------------------------------------------------------------------------ ?Recent Labs  ?  07/26/21 ?0227  ?TSH 1.605  ? ? ?Cardiac Enzymes ?No results for input(s): CKMB, TROPONINI, MYOGLOBIN in the last 168 hours. ? ?Invalid input(s): CK ?------------------------------------------------------------------------------------------------------------------ ?No results found for: BNP ? ?CBG: ?No results for input(s): GLUCAP in the last 168 hours. ? ?No results found for this or any previous visit (from the past 240 hour(s)).  ? ?Radiology Studies: ?MR BRAIN W WO CONTRAST ? ?Result Date: 07/25/2021 ?CLINICAL DATA:  Initial evaluation for demyelinating disease, worsening ataxia. EXAM: MRI HEAD WITHOUT AND WITH CONTRAST TECHNIQUE: Multiplanar, multiecho pulse sequences of the brain and surrounding structures were obtained without and with intravenous contrast. CONTRAST:  9mL GADAVIST GADOBUTROL 1 MMOL/ML IV SOLN COMPARISON:  Brain MRI from 07/17/2021. FINDINGS: Brain: Cerebral volume within normal limits for age. No evidence for acute or subacute infarct. Again seen is extensive patchy and confluent T2/FLAIR signal abnormality involving the periventricular, deep, and juxta cortical white matter both cerebral hemispheres. Extensive patchy involvement of the deep white matter tracts, brainstem, and cerebellum. Overall appearance is most concerning  for demyelinating disease. Multiple scattered enhancing lesion seen involving the bilateral cerebral hemispheres and brainstem, consistent with active demyelination. For reference purposes the, the most prominent of these is seen at the right ventral pons and measures 1 cm (series 18, image 15). No mass lesion, midline shift, or significant regional mass effect. No hydrocephalus or extra-axial fluid collection. Pituitary gland within normal limits for age. Midline structures intact and normal. No other pathologic enhancement. Vascular: Major intracranial vascular flow voids are well maintained. Skull and upper cervical spine: Craniocervical junction within normal limits. Bone marrow signal intensity diffusely decreased on T1 weighted imaging, which could be within normal limits for age. No focal marrow replacing lesion. No scalp soft tissue abnormality. Sinuses/Orbits: Globes orbital soft tissues demonstrate no acute finding. Paranasal sinuses are largely clear. No mastoid effusion. Other: None. IMPRESSION: 1. Extensive T2/FLAIR signal abnormality involving the supratentorial and infratentorial cerebral white matter, most concerning for demyelinating disease. Multiple scattered enhancing lesion involving the bilateral cerebral hemispheres and brainstem, consistent with active demyelination. 2. No other acute intracranial abnormality. Electronically Signed   By: Rise Mu M.D.   On: 07/25/2021 21:31  ? ?MR CERVICAL SPINE W WO CONTRAST ? ?  Result Date: 07/25/2021 ?CLINICAL DATA:  Initial evaluation for demyelinating disease. EXAM: MRI CERVICAL SPINE WITHOUT AND WITH CONTRAST TECHNIQUE: Multiplanar and multiecho pulse sequences of the cervical spine, to include the craniocervical junction and cervicothoracic junction, were obtained without and with intravenous contrast. CONTRAST:  49mL GADAVIST GADOBUTROL 1 MMOL/ML IV SOLN COMPARISON:  None. FINDINGS: Alignment: Straightening with mild reversal of the normal  cervical lordosis. No listhesis. Vertebrae: Vertebral body height maintained without acute or chronic fracture. Bone marrow signal intensity somewhat diffusely decreased on T1 weighted imaging, which could be withi

## 2021-07-26 NOTE — Evaluation (Signed)
Physical Therapy Evaluation ?Patient Details ?Name: Elizabeth Tran ?MRN: [id number] ?DOB: Sep 19, 2000 ?Today's Date: 07/26/2021 ? ?History of Present Illness ? 21 year old female with history of depression, anxiety, bipolar disorder who comes to the hospital with gradual lower extremity weakness, poor balance, few falls, going on for several weeks but progressively getting worse. Imaging revealed pt with Multiple Sclerosis, new diagnosis for patient. ?  ?Clinical Impression ? Pt admitted with above. Pt on her first day of steroid tx for newly diagnosed MS. Pt currently requiring min/modA for safe ambulation with RW. Pt also with ataxia t/o UEs, LEs, and trunk. Hopeful for improved function t/o steroid treatment. Pt currently lives here in [place] with 2 roommates but plans to return home to her Mom's house in [place], [place].   Acute PT to cont to follow. ?   ? ?Recommendations for follow up therapy are one component of a multi-disciplinary discharge planning process, led by the attending physician.  Recommendations may be updated based on patient status, additional functional criteria and insurance authorization. ? ?Follow Up Recommendations Home health PT ? ?  ?Assistance Recommended at Discharge Frequent or constant Supervision/Assistance  ?Patient can return home with the following ? A little help with walking and/or transfers;A little help with bathing/dressing/bathroom;Assist for transportation;Help with stairs or ramp for entrance ? ?  ?Equipment Recommendations Rolling walker (2 wheels)  ?Recommendations for Other Services ?    ?  ?Functional Status Assessment Patient has had a recent decline in their functional status and demonstrates the ability to make significant improvements in function in a reasonable and predictable amount of time.  ? ?  ?Precautions / Restrictions Precautions ?Precautions: Fall ?Restrictions ?Weight Bearing Restrictions: No  ? ?  ? ?Mobility ? Bed Mobility ?Overal bed mobility: Needs  Assistance ?Bed Mobility: Supine to Sit ?  ?  ?Supine to sit: Supervision ?  ?  ?General bed mobility comments: HOB elevated, no physical assist needed ?  ? ?Transfers ?Overall transfer level: Needs assistance ?Equipment used: Rolling walker (2 wheels) ?Transfers: Sit to/from Stand ?Sit to Stand: Min assist ?  ?  ?  ?  ?  ?General transfer comment: verbal cues for hand placement, minA to steady, truncal ataxia, bilat UEs shakiness/ataxia when transitioning hands from bed to RW ?  ? ?Ambulation/Gait ?Ambulation/Gait assistance: Min assist, Mod assist, +2 safety/equipment ?Gait Distance (Feet): 30 Feet (x1, 20x1, 50x1) ?Assistive device: Rolling walker (2 wheels), 1 person hand held assist ?Gait Pattern/deviations: Step-through pattern, Decreased stride length, Ataxic ?Gait velocity: decreased ?Gait velocity interpretation: <1.31 ft/sec, indicative of household ambulator ?  ?General Gait Details: first trialed RW, pt requiring minA, noted instability but able to control self. Pt wanted to try without, pt immeadiately reached for PT's hand for L HHA and then held onto hand rail and counter. pt very unsteady with truncal ataxia in addition to LE ataixa and weakness. pt given RW again and pt was more confident with increased step length however with onset of fatigue pt did become more shaky requiring pt to sit ? ?Stairs ?  ?  ?  ?  ?  ? ?Wheelchair Mobility ?  ? ?Modified Rankin (Stroke Patients Only) ?  ? ?  ? ?Balance Overall balance assessment: Needs assistance ?Sitting-balance support: No upper extremity supported, Feet supported ?Sitting balance-Leahy Scale: Normal ?  ?  ?Standing balance support: No upper extremity supported, During functional activity ?Standing balance-Leahy Scale: Poor ?Standing balance comment: dependent on RW/external support ?  ?  ?  ?  ?  ?  ?  ?  ?  ?  ?  ?   ? ? ? ?  Pertinent Vitals/Pain Pain Assessment ?Pain Assessment: No/denies pain  ? ? ?Home Living Family/patient expects to be  discharged to:: Unsure ?  ?  ?  ?  ?  ?  ?  ?  ?  ?Additional Comments: pt is a Consulting civil engineer at [employer] and lives in an off campus apartment with 2 roommates. Pt states she will probably go live with her Mom in [place], but her mom is a Midwife but her younger brother, 22yo, is also there  ?  ?Prior Function Prior Level of Function : Independent/Modified Independent ?  ?  ?  ?  ?  ?  ?Mobility Comments: pt was indep however recently started falling due to progressive weakness ?ADLs Comments: indep, difficulty recently due to progress weakness, impaired co-ordination ?  ? ? ?Hand Dominance  ? Dominant Hand: Right ? ?  ?Extremity/Trunk Assessment  ? Upper Extremity Assessment ?Upper Extremity Assessment: Generalized weakness (noted impaired co-ordination as well) ?  ? ?Lower Extremity Assessment ?Lower Extremity Assessment: Generalized weakness (bilat LE impaired co-ordination as well) ?  ? ?Cervical / Trunk Assessment ?Cervical / Trunk Assessment:  (mild truncal ataxia with amb but not in sitting)  ?Communication  ? Communication: No difficulties  ?Cognition Arousal/Alertness: Awake/alert ?Behavior During Therapy: [facility] for tasks assessed/performed ?Overall Cognitive Status: Within Functional Limits for tasks assessed ?  ?  ?  ?  ?  ?  ?  ?  ?  ?  ?  ?  ?  ?  ?  ?  ?General Comments: per chart pt with psych history, bipolar, depression and PTSD, pt cooperative and eager to participate it therapy, mildly flat however pt just received news of MS ?  ?  ? ?  ?General Comments General comments (skin integrity, edema, etc.): VSS ? ?  ?Exercises    ? ?Assessment/Plan  ?  ?PT Assessment Patient needs continued PT services  ?PT Problem List Decreased strength;Decreased range of motion;Decreased activity tolerance;Decreased balance;Decreased mobility;Decreased coordination;Decreased knowledge of use of DME ? ?   ?  ?PT Treatment Interventions DME instruction;Gait training;Stair training;Functional mobility  training;Therapeutic activities;Balance training;Therapeutic exercise;Cognitive remediation;Neuromuscular re-education;Patient/family education   ? ?PT Goals (Current goals can be found in the Care Plan section)  ?Acute Rehab PT Goals ?Patient Stated Goal: get better ?PT Goal Formulation: With patient ?Time For Goal Achievement: 08/09/21 ?Potential to Achieve Goals: Good ? ?  ?Frequency Min 4X/week ?  ? ? ?Co-evaluation   ?  ?  ?  ?  ? ? ?  ?AM-PAC PT "6 Clicks" Mobility  ?Outcome Measure Help needed turning from your back to your side while in a flat bed without using bedrails?: None ?Help needed moving from lying on your back to sitting on the side of a flat bed without using bedrails?: None ?Help needed moving to and from a bed to a chair (including a wheelchair)?: A Little ?Help needed standing up from a chair using your arms (e.g., wheelchair or bedside chair)?: A Little ?Help needed to walk in hospital room?: A Lot ?Help needed climbing 3-5 steps with a railing? : A Lot ?6 Click Score: 18 ? ?  ?End of Session Equipment Utilized During Treatment: Gait belt ?Activity Tolerance: Patient tolerated treatment well ?Patient left: in chair;with call bell/phone within reach;with chair alarm set;with nursing/sitter in room ?Nurse Communication: Mobility status ?PT Visit Diagnosis: Unsteadiness on feet (R26.81);Muscle weakness (generalized) (M62.81);Difficulty in walking, not elsewhere classified (R26.2) ?  ? ?Time: 8502-7741 ?PT Time Calculation (min) (ACUTE ONLY): 22 min ? ? ?  Charges:   PT Evaluation ?$PT Eval Moderate Complexity: 1 Mod ?  ?  ?   ? ? ?Lewis Shock, PT, DPT ?Acute Rehabilitation Services ?Pager #: [phone] ?Office #: [phone] ? ? ?Alpheus Stiff M Captola Teschner ?07/26/2021, 12:22 PM ? ?

## 2021-07-26 NOTE — Progress Notes (Signed)
CROSS COVER NOTE ? ?Elizabeth Tran ?[id number] ? ? ?S:  ? ?Nurse called earlier regarding findings on questioning that patient had had suicidal thoughts within the last month. Nurse stated she completed screening on patient, and patient stated she was not suicidal now. ? ? ?A/P: ? ?History of suicidal ideation. ?Nurse found pt had suicidal thoughts in recent past but not currently. ?Discussed case with nurse personally. ?Nurse to place order for daily screening for mood and risk of SI. ?Close monitoring. ?Nurse to alert attending if there is any concern that patient may be developing SI. ? ? ?

## 2021-07-26 NOTE — Consult Note (Signed)
[facility] Face-to-Face Psychiatry Consult  ? ?Reason for Consult:  Suicidal Ideations ?Referring Physician:  Pamella Pert ?Patient Identification: Elizabeth Tran ?MRN:  [id number] ?Principal Diagnosis: Multiple sclerosis (HCC) ?Diagnosis:  Principal Problem: ?  Multiple sclerosis (HCC) ?Active Problems: ?  Normocytic anemia ?  Hypokalemia ?  Depression ? ? ?Total Time spent with patient: 15 minutes ? ?Subjective:   ?Elizabeth Tran is a 21 y.o. female was seen and evaluated face-to-face.  Patient's significant other Elizabeth Tran at bedside.  Patient provided verbal authorization for  Elizabeth Tran to stay during this assessment.  She states she was feeling down and depressed related to  " my new MS diagnosis."  Reports "I did not want to believe this is happening to me." Stated that she was suicidal, but denied plan or intent. She reports previous suicide attempts 2 or 3 years ago.  States she attempted to hang herself.  Denied that she is experiencing similar thoughts of death currently.  Elizabeth Tran states was diagnosed with bipolar disorder at that time.   ? ?Currently,she is denying suicidal or homicidal ideations.  Denies auditory visual hallucinations.   ? ?States she is followed by psychiatry and therapy services from [facility], [place].  She reports she is prescribed Abilify which she reports taking and tolerating well.  Patient was restarted on Abilify 10 mg daily and Prozac 10 mg.  Denying any medication side effects. She reported a good appetite and states she is resting well throughout the night.  ? ?During evaluation Elizabeth Tran is sitting up in bed in no acute distress. She is alert/oriented x 4; calm/cooperative; and mood congruent with affect. She is speaking in a clear tone at moderate volume, and normal pace; with good eye contact.  Her thought process is coherent and relevant; There is no indication that she is currently responding to internal/external stimuli or experiencing delusional thought  content; and she has denied suicidal/self-harm/homicidal ideation, psychosis, and paranoia.   ?Patient has remained calm throughout assessment and has answered questions appropriately.   ? ? ? ?HPI: per admission assessment note " 21 year old female with history of depression, anxiety, bipolar disorder who comes to the hospital with gradual lower extremity weakness, poor balance, few falls, going on for several weeks but progressively getting worse. Imaging revealed pt with Multiple Sclerosis, new diagnosis for patient."  ? ?Past Psychiatric History:  ? ?Risk to Self:   ?Risk to Others:   ?Prior Inpatient Therapy:   ?Prior Outpatient Therapy:   ? ?Past Medical History: History reviewed. No pertinent past medical history. History reviewed. No pertinent surgical history. ?Family History: History reviewed. No pertinent family history. ?Family Psychiatric  History:  ?Social History:  ?Social History  ? ?Substance and Sexual Activity  ?Alcohol Use Not Currently  ?   ?Social History  ? ?Substance and Sexual Activity  ?Drug Use Yes  ? Types: Marijuana  ? Comment: edibles  ?  ?Social History  ? ?Socioeconomic History  ? Marital status: Single  ?  Spouse name: Not on file  ? Number of children: Not on file  ? Years of education: Not on file  ? Highest education level: Not on file  ?Occupational History  ? Not on file  ?Tobacco Use  ? Smoking status: Never  ?  Passive exposure: Never  ? Smokeless tobacco: Never  ?Substance and Sexual Activity  ? Alcohol use: Not Currently  ? Drug use: Yes  ?  Types: Marijuana  ?  Comment: edibles  ? Sexual activity: Not on file  ?  Other Topics Concern  ? Not on file  ?Social History Narrative  ? Right handed  ? ?Social Determinants of Health  ? ?Financial Resource Strain: Not on file  ?Food Insecurity: Not on file  ?Transportation Needs: Not on file  ?Physical Activity: Not on file  ?Stress: Not on file  ?Social Connections: Not on file  ? ?Additional Social History: ?  ? ?Allergies:  No Known  Allergies ? ?Labs:  ?Results for orders placed or performed during the hospital encounter of 07/25/21 (from the past 48 hour(s))  ?CBC with Differential     Status: Abnormal  ? Collection Time: 07/25/21  2:05 PM  ?Result Value Ref Range  ? WBC 5.3 4.0 - 10.5 K/uL  ? RBC 3.85 (L) 3.87 - 5.11 MIL/uL  ? Hemoglobin 11.2 (L) 12.0 - 15.0 g/dL  ? HCT 33.6 (L) 36.0 - 46.0 %  ? MCV 87.3 80.0 - 100.0 fL  ? MCH 29.1 26.0 - 34.0 pg  ? MCHC 33.3 30.0 - 36.0 g/dL  ? RDW 13.0 11.5 - 15.5 %  ? Platelets 253 150 - 400 K/uL  ? nRBC 0.0 0.0 - 0.2 %  ? Neutrophils Relative % 57 %  ? Neutro Abs 3.0 1.7 - 7.7 K/uL  ? Lymphocytes Relative 27 %  ? Lymphs Abs 1.5 0.7 - 4.0 K/uL  ? Monocytes Relative 10 %  ? Monocytes Absolute 0.5 0.1 - 1.0 K/uL  ? Eosinophils Relative 5 %  ? Eosinophils Absolute 0.3 0.0 - 0.5 K/uL  ? Basophils Relative 1 %  ? Basophils Absolute 0.0 0.0 - 0.1 K/uL  ? Immature Granulocytes 0 %  ? Abs Immature Granulocytes 0.01 0.00 - 0.07 K/uL  ?  Comment: Performed at [facility], [address], [place], [place] [address]  ?Basic metabolic panel     Status: Abnormal  ? Collection Time: 07/25/21  2:05 PM  ?Result Value Ref Range  ? Sodium 137 135 - 145 mmol/L  ? Potassium 3.4 (L) 3.5 - 5.1 mmol/L  ? Chloride 104 98 - 111 mmol/L  ? CO2 23 22 - 32 mmol/L  ? Glucose, Bld 85 70 - 99 mg/dL  ?  Comment: Glucose reference range applies only to samples taken after fasting for at least 8 hours.  ? BUN 8 6 - 20 mg/dL  ? Creatinine, Ser 0.64 0.44 - 1.00 mg/dL  ? Calcium 9.0 8.9 - 10.3 mg/dL  ? GFR, Estimated >60 >60 mL/min  ?  Comment: (NOTE) ?Calculated using the CKD-EPI Creatinine Equation (2021) ?  ? Anion gap 10 5 - 15  ?  Comment: Performed at [facility], [address], [place], [place] [address]  ?Urinalysis, Routine w reflex microscopic Urine, Clean Catch     Status: Abnormal  ? Collection Time: 07/25/21  2:41 PM  ?Result Value Ref Range  ? Color, Urine YELLOW YELLOW  ? APPearance HAZY (A) CLEAR  ? Specific  Gravity, Urine 1.029 1.005 - 1.030  ? pH 6.0 5.0 - 8.0  ? Glucose, UA NEGATIVE NEGATIVE mg/dL  ? Hgb urine dipstick NEGATIVE NEGATIVE  ? Bilirubin Urine NEGATIVE NEGATIVE  ? Ketones, ur NEGATIVE NEGATIVE mg/dL  ? Protein, ur NEGATIVE NEGATIVE mg/dL  ? Nitrite NEGATIVE NEGATIVE  ? Leukocytes,Ua NEGATIVE NEGATIVE  ?  Comment: Performed at [facility], [address] [place], [place] [address]  ?I-Stat beta hCG blood, ED     Status: None  ? Collection Time: 07/25/21  5:26 PM  ?  Result Value Ref Range  ? I-stat hCG, quantitative <5.0 <5 mIU/mL  ? Comment 3          ?  Comment:   GEST. AGE      CONC.  (mIU/mL) ?  <=1 WEEK        5 - 50 ?    2 WEEKS       50 - 500 ?    3 WEEKS       100 - 10,000 ?    4 WEEKS     1,000 - 30,000 ?       ?FEMALE AND NON-PREGNANT FEMALE: ?    LESS THAN 5 mIU/mL ?  ?HIV Antibody (routine testing w rflx)     Status: None  ? Collection Time: 07/26/21  2:27 AM  ?Result Value Ref Range  ? HIV Screen 4th Generation wRfx Non Reactive Non Reactive  ?  Comment: Performed at [facility], [address] [place], [place] [address]  ?TSH     Status: None  ? Collection Time: 07/26/21  2:27 AM  ?Result Value Ref Range  ? TSH 1.605 0.350 - 4.500 uIU/mL  ?  Comment: Performed by a 3rd Generation assay with a functional sensitivity of <=0.01 uIU/mL. ?Performed at [facility], [address], [place], [place] [address] ?  ?Comprehensive metabolic panel     Status: None  ? Collection Time: 07/26/21  2:27 AM  ?Result Value Ref Range  ? Sodium 138 135 - 145 mmol/L  ? Potassium 3.9 3.5 - 5.1 mmol/L  ? Chloride 108 98 - 111 mmol/L  ? CO2 25 22 - 32 mmol/L  ? Glucose, Bld 87 70 - 99 mg/dL  ?  Comment: Glucose reference range applies only to samples taken after fasting for at least 8 hours.  ? BUN 10 6 - 20 mg/dL  ? Creatinine, Ser 0.63 0.44 - 1.00 mg/dL  ? Calcium 9.2 8.9 - 10.3 mg/dL  ? Total Protein 6.6 6.5 - 8.1 g/dL  ? Albumin 3.6 3.5 - 5.0 g/dL  ? AST 16 15 - 41 U/L  ? ALT 10 0 - 44 U/L  ?  Alkaline Phosphatase 42 38 - 126 U/L  ? Total Bilirubin 0.6 0.3 - 1.2 mg/dL  ? GFR, Estimated >60 >60 mL/min  ?  Comment: (NOTE) ?Calculated using the CKD-EPI Creatinine Equation (2021) ?  ? Anion gap 5 5

## 2021-07-26 NOTE — Progress Notes (Signed)
S: Sx unchanged. Started on IVMP this AM based on active demyelination on MRI. ? ?Data: ? ?MRI brain wwo 4/1: ? ?Extensive T2/FLAIR signal abnormality involving the ?supratentorial and infratentorial cerebral white matter, most ?concerning for demyelinating disease. Multiple scattered enhancing ?lesion involving the bilateral cerebral hemispheres and brainstem, ?consistent with active demyelination. ? ?MRI c spine wwo 4/1: ? ?1. Extensive patchy signal abnormality throughout the cervical ?spinal cord, consistent with demyelinating disease. Associated ?enhancement about a lesion at the level of C6-7 consistent with ?active demyelination. ?2. Mild for age spinal stenosis at C3-4 through C6-7 without ?significant spinal stenosis. Associated mild left C6 and C7 ?foraminal narrowing. ? ?MRI t spine wwo 4/1:  ? ?Extensive patchy signal abnormality throughout the majority of ?the thoracic spinal cord, consistent with demyelinating disease. ?Probable subtle patchy enhancement about the lesions at the levels ?T7-8 and T8-9, consistent with active demyelination. ? ?CNS imaging personally reviewed; I agree with above interpretations ? ?O: ? ?Vitals:  ? 07/26/21 1306 07/26/21 1610  ?BP: 109/72 95/65  ?Pulse: 91 94  ?Resp: 14 14  ?Temp: 99.3 ?F (37.4 ?C) 99.1 ?F (37.3 ?C)  ?SpO2: 100% 99%  ? ? ? ?Physical Exam ?Gen: A&Ox4, NAD ?HEENT: Atraumatic, normocephalic; oropharynx clear, tongue without atrophy or fasciculations. ?Resp: CTAB, normal work of breathing ?CV: RRR, extremities appear well-perfused. ?Abd: soft/NT/ND ?Extrem: Nml bulk; no cyanosis, clubbing, or edema. ?  ?Neuro: ?*MS: A&O x4. Follows multi-step commands.  ?*Speech: no dysarthria or aphasia, able to name and repeat. ?*CN:  ?  I: Deferred ?  II,III: PERRLA, VFF by confrontation, optic discs not visualized 2/2 pupillary constriction ?  III,IV,VI: EOMI w/o nystagmus, no ptosis ?  V: Sensation intact from V1 to V3 to LT ?  VII: Eyelid closure was full.  Smile  symmetric. ?  VIII: Hearing intact to voice ?  IX,X: Voice normal, palate elevates symmetrically  ?  XI: SCM/trap 5/5 bilat   ?XII: Tongue protrudes midline, no atrophy or fasciculations  ?*Motor:   Normal bulk.  No tremor, rigidity or bradykinesia. No pronator drift. 5/5 strength throughout except 4+/5 L grip. ?*Sensory: Diffusely impaired to PP BLE w/ sensory level at approx T6 ?*Coordination:  Dysmetria R FNF and ataxia R HTS ?*Reflexes:  3+ throughout except 4+ bilat achilles (sustained clonus R, unsustained on L). Toes down bilat. ?*Gait: deferred ? ?A/P: 21 yo woman admitted for acute MS exacerbation, new dx MS. Highly-active disease with both chronic and enhancing lesions in MRI brain + c spine + t spine.  ? ?- IV solumedrol 1000mg  q 24 hrs x5 days end date 4/5 ?- Labwork prior to starting disease-modifying tx: CMP, CBC w/ diff, hepatitis panels, quantiferon gold, JC virus. She will start DMT as outpatient ?- Check vit D ?- Check PVR - patient has active thoracic cord lesion and reports subjective urinary retention ?- F/u with Dr. after discharge ? ?I spent 75 minutes spent in direct care of this patient today, more than half time involved counseling and coordination of care.This includes review of chart notes, available labs and data, examination of patient, ordering of testing/medications, discussions with patient, family, and other physicians, and completion of chart documentation. This is separate from any billable procedures. More than 50% was spent in counseling the patient/family and/or coordination of care with the care team regarding the prognosis and treatment plan.  ? ?I spent 45 min with patient and her mother discussing diagnosis, prognosis, treatment options, and reviewing MRI images of brain and spinal cord. ? ?  Bing Neighbors, MD ?[facility] ?[phone] ? ?If 7pm- 7am, please page neurology on call as listed in AMION. ? ? ?

## 2021-07-27 DIAGNOSIS — G379 Demyelinating disease of central nervous system, unspecified: Secondary | ICD-10-CM | POA: Diagnosis not present

## 2021-07-27 DIAGNOSIS — R42 Dizziness and giddiness: Secondary | ICD-10-CM | POA: Diagnosis not present

## 2021-07-27 DIAGNOSIS — G35 Multiple sclerosis: Secondary | ICD-10-CM | POA: Diagnosis not present

## 2021-07-27 DIAGNOSIS — R9089 Other abnormal findings on diagnostic imaging of central nervous system: Secondary | ICD-10-CM | POA: Diagnosis not present

## 2021-07-27 DIAGNOSIS — R270 Ataxia, unspecified: Secondary | ICD-10-CM | POA: Diagnosis not present

## 2021-07-27 MED ORDER — VITAMIN D (ERGOCALCIFEROL) 1.25 MG (50000 UNIT) PO CAPS
50000.0000 [IU] | ORAL_CAPSULE | ORAL | Status: DC
  Administered 2021-07-27: 50000 [IU] via ORAL
  Filled 2021-07-27: qty 1

## 2021-07-27 NOTE — Plan of Care (Signed)
Pt is alert oriented x 4, ambulatory, +1 assist with front wheel walker. Pt has voided. Post void residual completed,92mls.  Pt stated that her abdomen has felt numb. Pt stated she has not had a bowel movement in 4 days. She also stated she normally has BM every 2 weeks.  Pt had bath tonight, pt needed moderate assistance with washing. All linens changed, new socks applied. Pt resting at this time.  ? ? ? ?Problem: Education: ?Goal: Knowledge of General Education information will improve ?Description: Including pain rating scale, medication(s)/side effects and non-pharmacologic comfort measures ?Outcome: Progressing ?  ?Problem: Health Behavior/Discharge Planning: ?Goal: Ability to manage health-related needs will improve ?Outcome: Progressing ?  ?Problem: Clinical Measurements: ?Goal: Ability to maintain clinical measurements within normal limits will improve ?Outcome: Progressing ?Goal: Will remain free from infection ?Outcome: Progressing ?Goal: Diagnostic test results will improve ?Outcome: Progressing ?Goal: Respiratory complications will improve ?Outcome: Progressing ?Goal: Cardiovascular complication will be avoided ?Outcome: Progressing ?  ?Problem: Activity: ?Goal: Risk for activity intolerance will decrease ?Outcome: Progressing ?  ?Problem: Nutrition: ?Goal: Adequate nutrition will be maintained ?Outcome: Progressing ?  ?Problem: Coping: ?Goal: Level of anxiety will decrease ?Outcome: Progressing ?  ?Problem: Elimination: ?Goal: Will not experience complications related to bowel motility ?Outcome: Progressing ?Goal: Will not experience complications related to urinary retention ?Outcome: Progressing ?  ?Problem: Pain Managment: ?Goal: General experience of comfort will improve ?Outcome: Progressing ?  ?Problem: Safety: ?Goal: Ability to remain free from injury will improve ?Outcome: Progressing ?  ?Problem: Skin Integrity: ?Goal: Risk for impaired skin integrity will decrease ?Outcome: Progressing ?  ?

## 2021-07-27 NOTE — Progress Notes (Signed)
?PROGRESS NOTE ? ?Elizabeth Tran JFH:[id number] DOB: [birth year] DOA: 07/25/2021 ?PCP: Roycroft, Barbette Hair, MD ? ? LOS: 2 days  ? ?Brief Narrative / Interim history: ?21 year old female with history of depression, anxiety, bipolar disorder who comes to the hospital with gradual lower extremity weakness, poor balance, few falls, going on for several weeks but progressively getting worse.  She also has been experience numbness in her legs and abdomen.  She was evaluated as an outpatient, and there was concern for multiple sclerosis and sent for admission.  Neurology consulted.  Imaging in the ED confirmed MS diagnosis and she was started on high-dose steroids ? ?Subjective / 24h Interval events: ?Doing well today.  Appreciates that her abdomen numbness may be better today ? ?Assesement and Plan: ?Principal Problem: ?  Multiple sclerosis (HCC) ?Active Problems: ?  Normocytic anemia ?  Hypokalemia ?  Depression ? ?Assessment and Plan: ?Principal problem ?MS flare-neurology consulted, imaging consistent with this diagnosis.  Started on high-dose steroids for 5 days, today's day #2.  Monitor neuro progress.  PT consulted ? ?Active problems ?Normocytic anemia, iron deficiency anemia-patient states that she is on liquid iron at home.  Continue iron here ? ?Depression/anxiety/bipolar-she reports recent suicidal ideation but none currently.  Resume home medications.  Psychiatry evaluated patient ? ?Vitamin D deficiency-started supplements ? ? ?Scheduled Meds: ? ARIPiprazole  10 mg Oral Daily  ? ferrous sulfate  220 mg Oral Q breakfast  ? FLUoxetine  10 mg Oral QHS  ? pantoprazole (PROTONIX) IV  40 mg Intravenous Q24H  ? senna-docusate  1 tablet Oral Daily  ? Vitamin D (Ergocalciferol)  50,000 Units Oral Q7 days  ? ?Continuous Infusions: ? methylPREDNISolone (SOLU-MEDROL) injection 1,000 mg (07/27/21 1006)  ? ?PRN Meds:.acetaminophen (TYLENOL) oral liquid 160 mg/5 mL, meclizine, ondansetron **OR** ondansetron (ZOFRAN) IV ? ?Diet  Orders (From admission, onward)  ? ?  Start     Ordered  ? 07/25/21 1852  Diet regular Room service appropriate? Yes; Fluid consistency: Thin  Diet effective now       ?Question Answer Comment  ?Room service appropriate? Yes   ?Fluid consistency: Thin   ?  ? 07/25/21 1851  ? ?  ?  ? ?  ? ? ?DVT prophylaxis: SCDs Start: 07/25/21 1852 ? ? ?Lab Results  ?Component Value Date  ? PLT 221 07/26/2021  ? ? ?  Code Status: Full Code ? ?Family Communication: No family at bedside ? ?Status is: Inpatient ? ?Remains inpatient appropriate because: IV Solu-Medrol, severity of illness ? ? ?Level of care: Med-Surg ? ?Consultants:  ?Neurology ? ?Procedures:  ?none ? ?Microbiology  ?none ? ?Antimicrobials: ?none  ? ? ?Objective: ?Vitals:  ? 07/26/21 2031 07/26/21 2318 07/27/21 0417 07/27/21 0826  ?BP: 107/72 (!) 101/51 118/89 114/69  ?Pulse: 96 (!) 55 92 74  ?Resp: 16 15 15 18   ?Temp: 98.1 ?F (36.7 ?C) 98.7 ?F (37.1 ?C) 97.9 ?F (36.6 ?C) (!) 97.5 ?F (36.4 ?C)  ?TempSrc: Oral  Oral Oral  ?SpO2: 100% 100% 100% 99%  ? ? ?Intake/Output Summary (Last 24 hours) at 07/27/2021 1009 ?Last data filed at 07/27/2021 0240 ?Gross per 24 hour  ?Intake 846 ml  ?Output 63 ml  ?Net 783 ml  ? ? ?Wt Readings from Last 3 Encounters:  ?07/25/21 86.6 kg  ? ? ?Examination: ? ?Constitutional: NAD ?Eyes: lids and conjunctivae normal, no scleral icterus ?ENMT: mmm ?Neck: normal, supple ?Abdomen:  no distention ?Skin: no rashes ? ?Data Reviewed: I have independently reviewed following labs  and imaging studies  ? ?CBC ?Recent Labs  ?Lab 07/25/21 ?1405 07/26/21 ?0227  ?WBC 5.3 5.5  ?HGB 11.2* 10.3*  ?HCT 33.6* 30.5*  ?PLT 253 221  ?MCV 87.3 86.2  ?MCH 29.1 29.1  ?MCHC 33.3 33.8  ?RDW 13.0 13.0  ?LYMPHSABS 1.5  --   ?MONOABS 0.5  --   ?EOSABS 0.3  --   ?BASOSABS 0.0  --   ? ? ? ?Recent Labs  ?Lab 07/25/21 ?1405 07/26/21 ?0227  ?NA 137 138  ?K 3.4* 3.9  ?CL 104 108  ?CO2 23 25  ?GLUCOSE 85 87  ?BUN 8 10  ?CREATININE 0.64 0.63  ?CALCIUM 9.0 9.2  ?AST  --  16  ?ALT  --   10  ?ALKPHOS  --  42  ?BILITOT  --  0.6  ?ALBUMIN  --  3.6  ?INR  --  1.2  ?TSH  --  1.605  ? ? ? ?------------------------------------------------------------------------------------------------------------------ ?No results for input(s): CHOL, HDL, LDLCALC, TRIG, CHOLHDL, LDLDIRECT in the last 72 hours. ? ?Lab Results  ?Component Value Date  ? HGBA1C 5.5 12/30/2020  ? ?------------------------------------------------------------------------------------------------------------------ ?Recent Labs  ?  07/26/21 ?0227  ?TSH 1.605  ? ? ? ?Cardiac Enzymes ?No results for input(s): CKMB, TROPONINI, MYOGLOBIN in the last 168 hours. ? ?Invalid input(s): CK ?------------------------------------------------------------------------------------------------------------------ ?No results found for: BNP ? ?CBG: ?No results for input(s): GLUCAP in the last 168 hours. ? ?No results found for this or any previous visit (from the past 240 hour(s)).  ? ?Radiology Studies: ?No results found. ? ? ?Pamella Pert, MD, PhD ?[facility] ? ?Between 7 am - 7 pm I am available, please contact me via Amion (for emergencies) or Securechat (non urgent messages) ? ?Between 7 pm - 7 am I am not available, please contact night coverage MD/APP via Amion ? ?

## 2021-07-27 NOTE — Progress Notes (Signed)
Physical Therapy Treatment ?Patient Details ?Name: Elizabeth Tran ?MRN: [id number] ?DOB: [birth year] ?Today's Date: 07/27/2021 ? ? ?History of Present Illness 21 year old female with history of depression, anxiety, bipolar disorder who comes to the hospital with gradual lower extremity weakness, poor balance, few falls, going on for several weeks but progressively getting worse. Imaging revealed pt with Multiple Sclerosis, new diagnosis for patient. ? ?  ?PT Comments  ? ? Continuing work on functional mobility and activity tolerance;  Session focused on progressive ambulation; worked with cane, RW, and with no device, and overall steadiness is improving; On track for dc home; Will have a flight of steps to negotiate to get in home; Will keep monitoring progress -- might improve enough to skip HHPT and go straight of Outpt PT when she leaves the hospital   ?Recommendations for follow up therapy are one component of a multi-disciplinary discharge planning process, led by the attending physician.  Recommendations may be updated based on patient status, additional functional criteria and insurance authorization. ? ?Follow Up Recommendations ? Home health PT (Followed by Outpt PT) ?  ?  ?Assistance Recommended at Discharge Frequent or constant Supervision/Assistance  ?Patient can return home with the following A little help with walking and/or transfers;A little help with bathing/dressing/bathroom;Assist for transportation;Help with stairs or ramp for entrance ?  ?Equipment Recommendations ? Rolling walker (2 wheels)  ?  ?Recommendations for Other Services   ? ? ?  ?Precautions / Restrictions Precautions ?Precautions: Fall  ?  ? ?Mobility ? Bed Mobility ?Overal bed mobility: Needs Assistance ?Bed Mobility: Supine to Sit ?  ?  ?Supine to sit: Supervision ?  ?  ?General bed mobility comments: HOB elevated, no physical assist needed ?  ? ?Transfers ?Overall transfer level: Needs assistance ?Equipment used: None ?Transfers: Sit  to/from Stand ?Sit to Stand: Min guard (with and without physical contact) ?  ?  ?  ?  ?  ?General transfer comment: Smoother rise; close guard for safety, but no physical assist needed to steady; noted decr control of descent to sit; serial sit<>stands x3 ?  ? ?Ambulation/Gait ?Ambulation/Gait assistance: Min assist, Mod assist ?Gait Distance (Feet): 150 Feet (x2) ?Assistive device: None, Straight cane, Rolling walker (2 wheels) ?Gait Pattern/deviations: Step-through pattern, Decreased stride length, Ataxic ?  ?  ?  ?General Gait Details: Walked without assistive device, then with single point cane, and then with RW; less need for UE support with amb today; Noting tendency for harder footfall R and decr L stance stability; Overall good use of the cane (tried in both R and L hands), with good maintenance of gait pattern; Ultimately, pt most comfortable with amb with RW, and she was able to smooth out steps for more symmetrical pattern with Bil UE support ? ? ?Stairs ?  ?  ?  ?  ?  ? ? ?Wheelchair Mobility ?  ? ?Modified Rankin (Stroke Patients Only) ?  ? ? ?  ?Balance   ?  ?Sitting balance-Leahy Scale: Normal ?  ?  ?  ?Standing balance-Leahy Scale: Fair ?  ?  ?  ?  ?  ?  ?  ?  ?  ?  ?  ?  ?  ? ?  ?Cognition Arousal/Alertness: Awake/alert ?Behavior During Therapy: [facility] for tasks assessed/performed ?Overall Cognitive Status: Within Functional Limits for tasks assessed ?  ?  ?  ?  ?  ?  ?  ?  ?  ?  ?  ?  ?  ?  ?  ?  ?  General Comments: per chart pt with psych history, bipolar, depression and PTSD, pt cooperative and eager to participate it therapy, mildly flat however pt just received news of MS ?  ?  ? ?  ?Exercises   ? ?  ?General Comments General comments (skin integrity, edema, etc.): VSS; mother present and suportive ?  ?  ? ?Pertinent Vitals/Pain Pain Assessment ?Pain Assessment: No/denies pain  ? ? ?Home Living   ?  ?  ?  ?  ?  ?  ?  ?  ?  ?   ?  ?Prior Function    ?  ?  ?   ? ?PT Goals (current goals can now be  found in the care plan section) Acute Rehab PT Goals ?Patient Stated Goal: get better; wants to be able to graduate ?PT Goal Formulation: With patient ?Time For Goal Achievement: 08/09/21 ?Potential to Achieve Goals: Good ?Progress towards PT goals: Progressing toward goals ? ?  ?Frequency ? ? ? Min 4X/week ? ? ? ?  ?PT Plan Current plan remains appropriate  ? ? ?Co-evaluation   ?  ?  ?  ?  ? ?  ?AM-PAC PT "6 Clicks" Mobility   ?Outcome Measure ? Help needed turning from your back to your side while in a flat bed without using bedrails?: None ?Help needed moving from lying on your back to sitting on the side of a flat bed without using bedrails?: None ?Help needed moving to and from a bed to a chair (including a wheelchair)?: A Little ?Help needed standing up from a chair using your arms (e.g., wheelchair or bedside chair)?: A Little ?Help needed to walk in hospital room?: A Little ?Help needed climbing 3-5 steps with a railing? : A Lot ?6 Click Score: 19 ? ?  ?End of Session Equipment Utilized During Treatment: Gait belt ?Activity Tolerance: Patient tolerated treatment well ?Patient left: in chair;with call bell/phone within reach;with chair alarm set;with family/visitor present ?Nurse Communication: Mobility status ?PT Visit Diagnosis: Unsteadiness on feet (R26.81);Muscle weakness (generalized) (M62.81);Difficulty in walking, not elsewhere classified (R26.2) ?  ? ? ?Time: 0254-2706 ?PT Time Calculation (min) (ACUTE ONLY): 32 min ? ?Charges:  $Gait Training: 23-37 mins          ?          ? ?Van Clines, PT  ?Acute Rehabilitation Services ?Pager [phone] ?Office [phone] ? ? ? ?Levi Aland ?07/27/2021, 2:15 PM ? ?

## 2021-07-27 NOTE — Progress Notes (Signed)
Pt did not verbalized suicidal thoughts. Pt has worked on homework and watched tv. No distress noted.  ?

## 2021-07-27 NOTE — Evaluation (Signed)
Occupational Therapy Evaluation ?Patient Details ?Name: Elizabeth Tran ?MRN: [id number] ?DOB: 11-11-2000 ?Today's Date: 07/27/2021 ? ? ?History of Present Illness 21 year old female with history of depression, anxiety, bipolar disorder who comes to the hospital with gradual lower extremity weakness, poor balance, few falls, going on for several weeks but progressively getting worse. Imaging revealed pt with Multiple Sclerosis, new diagnosis for patient.  ? ?Clinical Impression ?  ?Pt admitted for concerns listed above. PTA pt reported independence with all ADL's and IADL's, including going toschool and driving. At this time, pt presents with weakness in BLE and her core, as well as balance concerns, and limited activity tolerance. Anticipate pt will progress to needing no follow up OT. OT will continue to follow acutely.   ?   ? ?Recommendations for follow up therapy are one component of a multi-disciplinary discharge planning process, led by the attending physician.  Recommendations may be updated based on patient status, additional functional criteria and insurance authorization.  ? ?Follow Up Recommendations ? No OT follow up (Pt should progress with OP PT)  ?  ?Assistance Recommended at Discharge PRN  ?Patient can return home with the following Help with stairs or ramp for entrance ? ?  ?Functional Status Assessment ? Patient has had a recent decline in their functional status and demonstrates the ability to make significant improvements in function in a reasonable and predictable amount of time.  ?Equipment Recommendations ? None recommended by OT  ?  ?Recommendations for Other Services   ? ? ?  ?Precautions / Restrictions Precautions ?Precautions: Fall ?Restrictions ?Weight Bearing Restrictions: No  ? ?  ? ?Mobility Bed Mobility ?Overal bed mobility: Needs Assistance ?Bed Mobility: Supine to Sit, Sit to Supine ?  ?  ?Supine to sit: Modified independent (Device/Increase time) ?Sit to supine: Min assist ?  ?General  bed mobility comments: Min A to bring BLE into bed ?  ? ?Transfers ?Overall transfer level: Needs assistance ?Equipment used: None ?Transfers: Sit to/from Stand ?Sit to Stand: Min guard ?  ?  ?  ?  ?  ?General transfer comment: Smoother rise; close guard for safety, but no physical assist needed to steady ?  ? ?  ?Balance Overall balance assessment: Mild deficits observed, not formally tested ?  ?  ?  ?  ?  ?  ?  ?  ?  ?  ?  ?  ?  ?  ?  ?  ?  ?  ?   ? ?ADL either performed or assessed with clinical judgement  ? ?ADL Overall ADL's : Needs assistance/impaired ?  ?  ?  ?  ?  ?  ?  ?  ?  ?  ?  ?  ?  ?  ?  ?  ?  ?  ?  ?General ADL Comments: Pt at min guard level due to weakness  ? ? ? ?Vision Baseline Vision/History: 0 No visual deficits ?Ability to See in Adequate Light: 0 Adequate ?Patient Visual Report: No change from baseline ?Vision Assessment?: No apparent visual deficits  ?   ?Perception   ?  ?Praxis   ?  ? ?Pertinent Vitals/Pain Pain Assessment ?Pain Assessment: No/denies pain  ? ? ? ?Hand Dominance Right ?  ?Extremity/Trunk Assessment Upper Extremity Assessment ?Upper Extremity Assessment: Overall WFL for tasks assessed ?  ?Lower Extremity Assessment ?Lower Extremity Assessment: Defer to PT evaluation ?  ?Cervical / Trunk Assessment ?Cervical / Trunk Assessment: Normal ?  ?Communication Communication ?Communication: No difficulties ?  ?Cognition  Arousal/Alertness: Awake/alert ?Behavior During Therapy: [facility] for tasks assessed/performed ?Overall Cognitive Status: Within Functional Limits for tasks assessed ?  ?  ?  ?  ?  ?  ?  ?  ?  ?  ?  ?  ?  ?  ?  ?  ?  ?  ?  ?General Comments  VSS on RA, mom and bf present and supportive ? ?  ?Exercises Exercises: Other exercises ?Other Exercises ?Other Exercises: abdominal contractions/squeezes ?Other Exercises: crunches in bed, focuses on lower abdomen ?Other Exercises: Kegel exercises/glute squeezes ?  ?Shoulder Instructions    ? ? ?Home Living Family/patient expects to be  discharged to:: Private residence ?Living Arrangements: Non-relatives/Friends ?Available Help at Discharge: Family;Friend(s) ?Type of Home: Apartment ?Home Access: Stairs to enter ?Entrance Stairs-Number of Steps: 2 flights at bf's, 3 flights at her apartment ?  ?Home Layout: One level ?  ?  ?Bathroom Shower/Tub: Tub/shower unit ?  ?Bathroom Toilet: Standard ?Bathroom Accessibility: No ?  ?Home Equipment: None ?  ?Additional Comments: pt is a Consulting civil engineer at [employer] and lives in an off campus apartment with 2 roommates. ?  ? ?  ?Prior Functioning/Environment Prior Level of Function : Independent/Modified Independent ?  ?  ?  ?  ?  ?  ?Mobility Comments: pt was indep however recently started falling due to progressive weakness ?ADLs Comments: indep, difficulty recently due to progress weakness, impaired co-ordination ?  ? ?  ?  ?OT Problem List: Decreased strength;Decreased activity tolerance;Impaired balance (sitting and/or standing);Decreased coordination ?  ?   ?OT Treatment/Interventions: Self-care/ADL training;Therapeutic exercise;Energy conservation;DME and/or AE instruction;Therapeutic activities;Patient/family education;Balance training  ?  ?OT Goals(Current goals can be found in the care plan section) Acute Rehab OT Goals ?Patient Stated Goal: To get stronger ?OT Goal Formulation: With patient ?Time For Goal Achievement: 08/10/21 ?Potential to Achieve Goals: Good ?ADL Goals ?Additional ADL Goal #1: Pt will maintain dynamic standing balance, independently, during 5 mins of functional tasks. ?Additional ADL Goal #2: Pt will complete core exercises independently with HEP.  ?OT Frequency: Min 2X/week ?  ? ?Co-evaluation   ?  ?  ?  ?  ? ?  ?AM-PAC OT "6 Clicks" Daily Activity     ?Outcome Measure Help from another person eating meals?: A Little ?Help from another person taking care of personal grooming?: A Little ?Help from another person toileting, which includes using toliet, bedpan, or urinal?: A Little ?Help from  another person bathing (including washing, rinsing, drying)?: A Little ?Help from another person to put on and taking off regular upper body clothing?: A Little ?Help from another person to put on and taking off regular lower body clothing?: A Little ?6 Click Score: 18 ?  ?End of Session Equipment Utilized During Treatment: Rolling walker (2 wheels) ?Nurse Communication: Mobility status ? ?Activity Tolerance: Patient tolerated treatment well ?Patient left: in bed;with call bell/phone within reach;with family/visitor present ? ?OT Visit Diagnosis: Unsteadiness on feet (R26.81);Other abnormalities of gait and mobility (R26.89);Muscle weakness (generalized) (M62.81)  ?              ?Time: 5916-3846 ?OT Time Calculation (min): 23 min ?Charges:  OT General Charges ?$OT Visit: 1 Visit ?OT Evaluation ?$OT Eval Moderate Complexity: 1 Mod ?OT Treatments ?$Self Care/Home Management : 8-22 mins ? ?Cierah Crader H., OTR/L ?Acute Rehabilitation ? ?Lacee Grey Elane Bing Plume ?07/27/2021, 8:21 PM ?

## 2021-07-28 ENCOUNTER — Telehealth: Payer: Self-pay | Admitting: Neurology

## 2021-07-28 DIAGNOSIS — G35 Multiple sclerosis: Secondary | ICD-10-CM

## 2021-07-28 DIAGNOSIS — G379 Demyelinating disease of central nervous system, unspecified: Secondary | ICD-10-CM

## 2021-07-28 NOTE — Telephone Encounter (Signed)
Patient told kelly that jaffe ordered out patient therapy. [phone] ? ?[facility]- kelly would like to know if that is true and where jaffe likes to go for that ?

## 2021-07-28 NOTE — Progress Notes (Addendum)
S: Day 2 of 5 IVMP for active demyelination on MRI brain. Tightness in abdomen and dizziness slightly improved. Tolerating IVMP well. ? ?Data: ? ?MRI brain wwo 4/1: ? ?Extensive T2/FLAIR signal abnormality involving the ?supratentorial and infratentorial cerebral white matter, most ?concerning for demyelinating disease. Multiple scattered enhancing ?lesion involving the bilateral cerebral hemispheres and brainstem, ?consistent with active demyelination. ? ?MRI c spine wwo 4/1: ? ?1. Extensive patchy signal abnormality throughout the cervical ?spinal cord, consistent with demyelinating disease. Associated ?enhancement about a lesion at the level of C6-7 consistent with ?active demyelination. ?2. Mild for age spinal stenosis at C3-4 through C6-7 without ?significant spinal stenosis. Associated mild left C6 and C7 ?foraminal narrowing. ? ?MRI t spine wwo 4/1:  ? ?Extensive patchy signal abnormality throughout the majority of ?the thoracic spinal cord, consistent with demyelinating disease. ?Probable subtle patchy enhancement about the lesions at the levels ?T7-8 and T8-9, consistent with active demyelination. ? ?CNS imaging personally reviewed; I agree with above interpretations ? ?O: ? ?Vitals:  ? 07/28/21 0021 07/28/21 0341  ?BP: 100/70 107/63  ?Pulse: 71 73  ?Resp: 16 16  ?Temp: 98.2 ?F (36.8 ?C) 97.7 ?F (36.5 ?C)  ?SpO2: 100% 100%  ? ? ? ?Physical Exam ?Gen: A&Ox4, NAD ?HEENT: Atraumatic, normocephalic; oropharynx clear, tongue without atrophy or fasciculations. ?Resp: CTAB, normal work of breathing ?CV: RRR, extremities appear well-perfused. ?Abd: soft/NT/ND ?Extrem: Nml bulk; no cyanosis, clubbing, or edema. ?  ?Neuro: ?*MS: A&O x4. Follows multi-step commands.  ?*Speech: no dysarthria or aphasia, able to name and repeat. ?*CN:  ?  I: Deferred ?  II,III: PERRLA, VFF by confrontation, optic discs not visualized 2/2 pupillary constriction ?  III,IV,VI: EOMI w/o nystagmus, no ptosis ?  V: Sensation intact from V1 to  V3 to LT ?  VII: Eyelid closure was full.  Smile symmetric. ?  VIII: Hearing intact to voice ?  IX,X: Voice normal, palate elevates symmetrically  ?  XI: SCM/trap 5/5 bilat   ?XII: Tongue protrudes midline, no atrophy or fasciculations  ?*Motor:   Normal bulk.  No tremor, rigidity or bradykinesia. No pronator drift. 5/5 strength throughout except 4+/5 L grip. ?*Sensory: Diffusely impaired to PP BLE w/ sensory level at approx T6 ?*Coordination:  Dysmetria R FNF and ataxia R HTS ?*Reflexes:  3+ throughout except 4+ bilat achilles (sustained clonus R, unsustained on L). Toes down bilat. ?*Gait: deferred ? ?A/P: 21 yo woman admitted for acute MS exacerbation, new dx MS. Highly-active disease with both chronic and enhancing lesions in MRI brain + c spine + t spine.  ? ?- IV solumedrol 1000mg  q 24 hrs x5 days end date 4/5 ?- Labwork prior to starting disease-modifying tx ordered: CMP, CBC w/ diff, hepatitis panels, quantiferon gold, JC virus. She will start DMT as outpatient ?- Check vit D ?- Check PVR - patient has active thoracic cord lesion and reports subjective urinary retention ?- F/u with Dr. after discharge ? ? ?Everlena Cooper, MD ?[facility] ?[phone] ? ?If 7pm- 7am, please page neurology on call as listed in AMION. ? ? ?

## 2021-07-28 NOTE — Progress Notes (Signed)
Physical Therapy Treatment ?Patient Details ?Name: Elizabeth Tran ?MRN: [id number] ?DOB: [birth year] ?Today's Date: 07/28/2021 ? ? ?History of Present Illness 21 year old female with history of depression, anxiety, bipolar disorder who comes to the hospital with gradual lower extremity weakness, poor balance, few falls, going on for several weeks but progressively getting worse. Imaging revealed pt with Multiple Sclerosis, new diagnosis for patient. ? ?  ?PT Comments  ? ? Pt progressing well. Pt with improved ambulation tolerance this date with RW. Pt continues to report bilat LEs to feel numb and heavy. Began LE HEP, pt reporting "ooo I feel those muscles". Pt given HEP and instructed to not push past fatigue. Pt remains motivated to get better and amb without AD. Acute PT to cont to follow. ?   ?Recommendations for follow up therapy are one component of a multi-disciplinary discharge planning process, led by the attending physician.  Recommendations may be updated based on patient status, additional functional criteria and insurance authorization. ? ?Follow Up Recommendations ? Outpatient PT ?  ?  ?Assistance Recommended at Discharge Frequent or constant Supervision/Assistance  ?Patient can return home with the following A little help with walking and/or transfers;A little help with bathing/dressing/bathroom;Assist for transportation;Help with stairs or ramp for entrance ?  ?Equipment Recommendations ? Rolling walker (2 wheels)  ?  ?Recommendations for Other Services   ? ? ?  ?Precautions / Restrictions Precautions ?Precautions: Fall ?Restrictions ?Weight Bearing Restrictions: No  ?  ? ?Mobility ? Bed Mobility ?Overal bed mobility: Needs Assistance ?Bed Mobility: Supine to Sit ?  ?  ?Supine to sit: Modified independent (Device/Increase time) ?  ?  ?General bed mobility comments: HOB all the way elevated ?  ? ?Transfers ?Overall transfer level: Needs assistance ?Equipment used: Rolling walker (2 wheels) ?Transfers: Sit  to/from Stand ?Sit to Stand: Min guard ?  ?  ?  ?  ?  ?General transfer comment: verbal cues for hand placement, increased time, mild instability ?  ? ?Ambulation/Gait ?Ambulation/Gait assistance: Min assist ?Gait Distance (Feet): 200 Feet ?Assistive device: Rolling walker (2 wheels) ?Gait Pattern/deviations: Step-through pattern, Decreased stride length, Ataxic ?Gait velocity: dec ?Gait velocity interpretation: 1.31 - 2.62 ft/sec, indicative of limited community ambulator ?  ?General Gait Details: walked 2 laps around RN station with RW without stopping, pt with fluid, continuous anterior push of RW, pt continues with mild ataxia but with improved endurance this date ? ? ?Stairs ?  ?  ?  ?  ?  ? ? ?Wheelchair Mobility ?  ? ?Modified Rankin (Stroke Patients Only) ?  ? ? ?  ?Balance Overall balance assessment: Mild deficits observed, not formally tested ?Sitting-balance support: No upper extremity supported, Feet supported ?Sitting balance-Leahy Scale: Good ?  ?  ?Standing balance support: No upper extremity supported, During functional activity ?Standing balance-Leahy Scale: Fair ?Standing balance comment: pt able to wash hands at the sink without leaning against sink ?  ?  ?  ?  ?  ?  ?  ?  ?  ?  ?  ?  ? ?  ?Cognition Arousal/Alertness: Awake/alert ?Behavior During Therapy: [facility] for tasks assessed/performed ?Overall Cognitive Status: Within Functional Limits for tasks assessed ?  ?  ?  ?  ?  ?  ?  ?  ?  ?  ?  ?  ?  ?  ?  ?  ?General Comments: per chart pt with psych history, bipolar, depression and PTSD, pt cooperative and eager to participate it therapy, mildly flat however pt just  received news of MS ?  ?  ? ?  ?Exercises General Exercises - Lower Extremity ?Ankle Circles/Pumps: AROM, Both, 5 reps, Seated ?Long Arc Quad: AROM, Both, 10 reps, Seated ?Heel Slides: AROM, Both, 10 reps, Seated (against resistance) ? ?  ?General Comments General comments (skin integrity, edema, etc.): VSS on RA, Mom present. pt  assisted to the bathroom, minA for walker management into bathroom, indep with hygiene ?  ?  ? ?Pertinent Vitals/Pain Pain Assessment ?Pain Assessment: No/denies pain  ? ? ?Home Living   ?  ?  ?  ?  ?  ?  ?  ?  ?  ?   ?  ?Prior Function    ?  ?  ?   ? ?PT Goals (current goals can now be found in the care plan section) Progress towards PT goals: Progressing toward goals ? ?  ?Frequency ? ? ? Min 4X/week ? ? ? ?  ?PT Plan Discharge plan needs to be updated  ? ? ?Co-evaluation   ?  ?  ?  ?  ? ?  ?AM-PAC PT "6 Clicks" Mobility   ?Outcome Measure ? Help needed turning from your back to your side while in a flat bed without using bedrails?: None ?Help needed moving from lying on your back to sitting on the side of a flat bed without using bedrails?: None ?Help needed moving to and from a bed to a chair (including a wheelchair)?: A Little ?Help needed standing up from a chair using your arms (e.g., wheelchair or bedside chair)?: A Little ?Help needed to walk in hospital room?: A Little ?Help needed climbing 3-5 steps with a railing? : A Little ?6 Click Score: 20 ? ?  ?End of Session Equipment Utilized During Treatment: Gait belt ?Activity Tolerance: Patient tolerated treatment well ?Patient left: in chair;with call bell/phone within reach;with chair alarm set;with family/visitor present ?Nurse Communication: Mobility status ?PT Visit Diagnosis: Unsteadiness on feet (R26.81);Muscle weakness (generalized) (M62.81);Difficulty in walking, not elsewhere classified (R26.2) ?  ? ? ?Time: 0240-9735 ?PT Time Calculation (min) (ACUTE ONLY): 23 min ? ?Charges:  $Gait Training: 8-22 mins ?$Therapeutic Exercise: 8-22 mins          ?          ? ?Lewis Shock, PT, DPT ?Acute Rehabilitation Services ?Secure chat preferred ?Office #: [phone] ? ? ? ?Jasaiah Karwowski M Shalisha Clausing ?07/28/2021, 2:34 PM ? ?

## 2021-07-28 NOTE — TOC Initial Note (Signed)
Transition of Care (TOC) - Initial/Assessment Note  ? ? ?Patient Details  ?Name: Elizabeth Tran ?MRN: [id number] ?Date of Birth: [birth year] ? ?Transition of Care (TOC) CM/SW Contact:    ?Pollie Friar, RN ?Phone Number: ?07/28/2021, 1:41 PM ? ?Clinical Narrative:                 ?Patient is a Ship broker at [employer]. CM met with her to see where she was to attend outpatient therapy. She states she had seen Dr Tomi Likens but was not sure about outpatient therapy and where she was to attend. CM called Dr Tomi Likens office and they had not arranged for outpatient therapy.  ?CM will arrange outpatient therapy as close to [employer] as possible. Pt states she has transportation.  ? ? ?Expected Discharge Plan: OP Rehab ?Barriers to Discharge: Continued Medical Work up ? ? ?Patient Goals and CMS Choice ?  ?  ?Choice offered to / list presented to : Patient ? ?Expected Discharge Plan and Services ?Expected Discharge Plan: OP Rehab ?  ?Discharge Planning Services: CM Consult ?  ?Living arrangements for the past 2 months: Apartment ?                ?  ?  ?  ?  ?  ?  ?  ?  ?  ?  ? ?Prior Living Arrangements/Services ?Living arrangements for the past 2 months: Apartment ?Lives with:: Roommate ?Patient language and need for interpreter reviewed:: Yes ?Do you feel safe going back to the place where you live?: Yes      ?  ?  ?  ?Criminal Activity/Legal Involvement Pertinent to Current Situation/Hospitalization: No - Comment as needed ? ?Activities of Daily Living ?Home Assistive Devices/Equipment: None ?ADL Screening (condition at time of admission) ?Patient's cognitive ability adequate to safely complete daily activities?: Yes ?Is the patient deaf or have difficulty hearing?: Yes ?Does the patient have difficulty seeing, even when wearing glasses/contacts?: No ?Does the patient have difficulty concentrating, remembering, or making decisions?: No ?Patient able to express need for assistance with ADLs?: Yes ?Does the patient have difficulty  dressing or bathing?: No ?Independently performs ADLs?: No ?Communication: Needs assistance ?Is this a change from baseline?: Change from baseline, expected to last <3 days ?Dressing (OT): Needs assistance ?Is this a change from baseline?: Change from baseline, expected to last <3days ?Grooming: Needs assistance ?Is this a change from baseline?: Change from baseline, expected to last <3 days ?Feeding: Independent ?Bathing: Needs assistance ?Is this a change from baseline?: Change from baseline, expected to last <3 days ?Toileting: Needs assistance ?Is this a change from baseline?: Change from baseline, expected to last <3 days ?In/Out Bed: Needs assistance ?Is this a change from baseline?: Change from baseline, expected to last <3 days ?Walks in Home: Needs assistance ?Is this a change from baseline?: Change from baseline, expected to last <3 days ?Does the patient have difficulty walking or climbing stairs?: Yes ?Weakness of Legs: Right ?Weakness of Arms/Hands: None ? ?Permission Sought/Granted ?  ?  ?   ?   ?   ?   ? ?Emotional Assessment ?Appearance:: Appears stated age ?Attitude/Demeanor/Rapport: Engaged ?Affect (typically observed): Accepting ?Orientation: : Oriented to Self, Oriented to Place, Oriented to  Time, Oriented to Situation ?  ?Psych Involvement: No (comment) ? ?Admission diagnosis:  Multiple sclerosis ([place]) [G35] ?Ataxia [R27.0] ?Demyelinating disease ([place]) [G37.9] ?Patient Active Problem List  ? Diagnosis Date Noted  ? Depression 07/26/2021  ? Multiple sclerosis ([place]) 07/25/2021  ? Normocytic  anemia 07/25/2021  ? Hypokalemia 07/25/2021  ? 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 ?PCP:  Roycroft, Sandy Salaam, MD ?Pharmacy:   ?[facility] [place] [place] [facility] ?[address] ?[place] [address] ?Phone: [phone] Fax:  [phone] ? ? ? ? ?Social Determinants of Health (SDOH) Interventions ?  ? ?Readmission Risk Interventions ?   ? View : No data to display.  ?  ?  ?  ? ? ? ?

## 2021-07-28 NOTE — Progress Notes (Signed)
?PROGRESS NOTE ? ?Elizabeth Tran [id number] DOB: Jun 16, 2000 DOA: 07/25/2021 ?PCP: Roycroft, Sandy Salaam, MD ? ? LOS: 3 days  ? ?Brief Narrative / Interim history: ?21 year old female with history of depression, anxiety, bipolar disorder who comes to the hospital with gradual lower extremity weakness, poor balance, few falls, going on for several weeks but progressively getting worse.  She also has been experience numbness in her legs and abdomen.  She was evaluated as an outpatient, and there was concern for multiple sclerosis and sent for admission.  Neurology consulted.  Imaging in the ED confirmed MS diagnosis and she was started on high-dose steroids ? ?Subjective / 24h Interval events: ?No chest pain, no shortness of breath.  Weakness is slightly better but numbness still present ? ?Assesement and Plan: ?Principal Problem: ?  Multiple sclerosis ([place]) ?Active Problems: ?  Normocytic anemia ?  Hypokalemia ?  Depression ? ?Assessment and Plan: ?Principal problem ?MS flare-neurology consulted, imaging consistent with this diagnosis.  Started on high-dose steroids for 5 days, today's day #3.  Monitor neuro progress.  PT consulted ? ?Active problems ?Normocytic anemia, iron deficiency anemia-patient states that she is on liquid iron at home.  Continue iron here ? ?Depression/anxiety/bipolar-she reports recent suicidal ideation but none currently.  Resume home medications.  Psychiatry evaluated patient ? ?Vitamin D deficiency-started supplements ? ? ?Scheduled Meds: ? ARIPiprazole  10 mg Oral Daily  ? ferrous sulfate  220 mg Oral Q breakfast  ? FLUoxetine  10 mg Oral QHS  ? pantoprazole (PROTONIX) IV  40 mg Intravenous Q24H  ? senna-docusate  1 tablet Oral Daily  ? Vitamin D (Ergocalciferol)  50,000 Units Oral Q7 days  ? ?Continuous Infusions: ? methylPREDNISolone (SOLU-MEDROL) injection 1,000 mg (07/28/21 0925)  ? ?PRN Meds:.acetaminophen (TYLENOL) oral liquid 160 mg/5 mL, meclizine, ondansetron **OR** ondansetron  (ZOFRAN) IV ? ?Diet Orders (From admission, onward)  ? ?  Start     Ordered  ? 07/25/21 1852  Diet regular Room service appropriate? Yes; Fluid consistency: Thin  Diet effective now       ?Question Answer Comment  ?Room service appropriate? Yes   ?Fluid consistency: Thin   ?  ? 07/25/21 1851  ? ?  ?  ? ?  ? ? ?DVT prophylaxis: SCDs Start: 07/25/21 1852 ? ? ?Lab Results  ?Component Value Date  ? PLT 221 07/26/2021  ? ? ?  Code Status: Full Code ? ?Family Communication: No family at bedside ? ?Status is: Inpatient ? ?Remains inpatient appropriate because: IV Solu-Medrol, severity of illness ? ? ?Level of care: Med-Surg ? ?Consultants:  ?Neurology ? ?Procedures:  ?none ? ?Microbiology  ?none ? ?Antimicrobials: ?none  ? ? ?Objective: ?Vitals:  ? 07/27/21 2051 07/28/21 0021 07/28/21 0341 07/28/21 0825  ?BP: 116/73 100/70 107/63 109/71  ?Pulse: 75 71 73 72  ?Resp: 15 16 16 16   ?Temp: 98 ?F (36.7 ?C) 98.2 ?F (36.8 ?C) 97.7 ?F (36.5 ?C) 98.3 ?F (36.8 ?C)  ?TempSrc: Oral Oral Oral Oral  ?SpO2: 100% 100% 100% 100%  ? ?No intake or output data in the 24 hours ending 07/28/21 1010 ? ?Wt Readings from Last 3 Encounters:  ?07/25/21 86.6 kg  ? ? ?Examination: ? ?Constitutional: NAD ?Respiratory: clear to auscultation bilaterally, no wheezing, no crackles. Normal respiratory effort.  ?Cardiovascular: Regular rate and rhythm, no murmurs / rubs / gallops. No LE edema. ? ?Data Reviewed: I have independently reviewed following labs and imaging studies  ? ?CBC ?Recent Labs  ?Lab 07/25/21 ?1405 07/26/21 ?0227  ?  WBC 5.3 5.5  ?HGB 11.2* 10.3*  ?HCT 33.6* 30.5*  ?PLT 253 221  ?MCV 87.3 86.2  ?MCH 29.1 29.1  ?MCHC 33.3 33.8  ?RDW 13.0 13.0  ?LYMPHSABS 1.5  --   ?MONOABS 0.5  --   ?EOSABS 0.3  --   ?BASOSABS 0.0  --   ? ? ? ?Recent Labs  ?Lab 07/25/21 ?1405 07/26/21 ?0227  ?NA 137 138  ?K 3.4* 3.9  ?CL 104 108  ?CO2 23 25  ?GLUCOSE 85 87  ?BUN 8 10  ?CREATININE 0.64 0.63  ?CALCIUM 9.0 9.2  ?AST  --  16  ?ALT  --  10  ?ALKPHOS  --  42   ?BILITOT  --  0.6  ?ALBUMIN  --  3.6  ?INR  --  1.2  ?TSH  --  1.605  ? ? ? ?------------------------------------------------------------------------------------------------------------------ ?No results for input(s): CHOL, HDL, LDLCALC, TRIG, CHOLHDL, LDLDIRECT in the last 72 hours. ? ?Lab Results  ?Component Value Date  ? HGBA1C 5.5 12/30/2020  ? ?------------------------------------------------------------------------------------------------------------------ ?Recent Labs  ?  07/26/21 ?0227  ?TSH 1.605  ? ? ? ?Cardiac Enzymes ?No results for input(s): CKMB, TROPONINI, MYOGLOBIN in the last 168 hours. ? ?Invalid input(s): CK ?------------------------------------------------------------------------------------------------------------------ ?No results found for: BNP ? ?CBG: ?No results for input(s): GLUCAP in the last 168 hours. ? ?No results found for this or any previous visit (from the past 240 hour(s)).  ? ?Radiology Studies: ?No results found. ? ? ?Marzetta Board, MD, PhD ?[facility] ? ?Between 7 am - 7 pm I am available, please contact me via Amion (for emergencies) or Securechat (non urgent messages) ? ?Between 7 pm - 7 am I am not available, please contact night coverage MD/APP via Amion ? ?

## 2021-07-28 NOTE — Telephone Encounter (Signed)
[facility] for [facility] is what we normally use.  ?

## 2021-07-29 DIAGNOSIS — G35 Multiple sclerosis: Secondary | ICD-10-CM | POA: Diagnosis not present

## 2021-07-29 MED ORDER — POLYETHYLENE GLYCOL 3350 17 G PO PACK
17.0000 g | PACK | Freq: Every day | ORAL | Status: DC
  Administered 2021-07-29 – 2021-07-30 (×2): 17 g via ORAL
  Filled 2021-07-29 (×2): qty 1

## 2021-07-29 NOTE — Progress Notes (Signed)
?PROGRESS NOTE ? ?Elizabeth Tran ZHY:[id number] DOB: Aug 19, 2000 DOA: 07/25/2021 ?PCP: Roycroft, Barbette Hair, MD ? ? LOS: 4 days  ? ?Brief Narrative / Interim history: ?21 year old female with history of depression, anxiety, bipolar disorder who comes to the hospital with gradual lower extremity weakness, poor balance, few falls, going on for several weeks but progressively getting worse.  She also has been experience numbness in her legs and abdomen.  She was evaluated as an outpatient, and there was concern for multiple sclerosis and sent for admission.  Neurology consulted.  Imaging in the ED confirmed MS diagnosis and she was started on high-dose steroids ? ?Subjective / 24h Interval events: ?No significant complaints other than constipation.  She is feeling stronger. ? ?Assesement and Plan: ?Principal Problem: ?  Multiple sclerosis ([facility]) ?Active Problems: ?  Normocytic anemia ?  Hypokalemia ?  Depression ? ?Assessment and Plan: ?Principal problem ?MS flare-neurology consulted, imaging consistent with this diagnosis.  Started on high-dose steroids for 5 days, today's day #4.  Monitor neuro progress.  Worked with PT, outpatient PT recommended ? ?Active problems ?Normocytic anemia, iron deficiency anemia-patient states that she is on liquid iron at home.  Continue iron here ? ?Depression/anxiety/bipolar-she reports recent suicidal ideation but none currently.  Resume home medications.  Psychiatry evaluated patient ? ?Vitamin D deficiency-started supplements ? ? ?Scheduled Meds: ? ARIPiprazole  10 mg Oral Daily  ? ferrous sulfate  220 mg Oral Q breakfast  ? FLUoxetine  10 mg Oral QHS  ? pantoprazole (PROTONIX) IV  40 mg Intravenous Q24H  ? polyethylene glycol  17 g Oral Daily  ? senna-docusate  1 tablet Oral Daily  ? Vitamin D (Ergocalciferol)  50,000 Units Oral Q7 days  ? ?Continuous Infusions: ? methylPREDNISolone (SOLU-MEDROL) injection 1,000 mg (07/28/21 0925)  ? ?PRN Meds:.acetaminophen (TYLENOL) oral liquid 160  mg/5 mL, meclizine, ondansetron **OR** ondansetron (ZOFRAN) IV ? ?Diet Orders (From admission, onward)  ? ?  Start     Ordered  ? 07/25/21 1852  Diet regular Room service appropriate? Yes; Fluid consistency: Thin  Diet effective now       ?Question Answer Comment  ?Room service appropriate? Yes   ?Fluid consistency: Thin   ?  ? 07/25/21 1851  ? ?  ?  ? ?  ? ? ?DVT prophylaxis: SCDs Start: 07/25/21 1852 ? ? ?Lab Results  ?Component Value Date  ? PLT 221 07/26/2021  ? ? ?  Code Status: Full Code ? ?Family Communication: No family at bedside, discussed with mother 4/30 ? ?Status is: Inpatient ? ?Remains inpatient appropriate because: IV Solu-Medrol, severity of illness ? ? ?Level of care: Med-Surg ? ?Consultants:  ?Neurology ? ?Procedures:  ?none ? ?Microbiology  ?none ? ?Antimicrobials: ?none  ? ? ?Objective: ?Vitals:  ? 07/28/21 2000 07/29/21 2952 07/29/21 0308 07/29/21 0804  ?BP: 106/63 (!) 99/59 106/68 110/77  ?Pulse: 76 (!) 59 (!) 59 79  ?Resp: 16 16 16    ?Temp: 98.6 ?F (37 ?C) 98.5 ?F (36.9 ?C) 98.7 ?F (37.1 ?C) 99.2 ?F (37.3 ?C)  ?TempSrc: Oral Oral Oral Oral  ?SpO2: 100% 99% 100% 100%  ? ?No intake or output data in the 24 hours ending 07/29/21 1052 ? ?Wt Readings from Last 3 Encounters:  ?07/25/21 86.6 kg  ? ? ?Examination: ? ?Constitutional: NAD ?Respiratory: clear to auscultation bilaterally, no wheezing, no crackles. Normal respiratory effort.  ?Cardiovascular: Regular rate and rhythm, no murmurs / rubs / gallops ? ?Data Reviewed: I have independently reviewed following labs and imaging  studies  ? ?CBC ?Recent Labs  ?Lab 07/25/21 ?1405 07/26/21 ?0227  ?WBC 5.3 5.5  ?HGB 11.2* 10.3*  ?HCT 33.6* 30.5*  ?PLT 253 221  ?MCV 87.3 86.2  ?MCH 29.1 29.1  ?MCHC 33.3 33.8  ?RDW 13.0 13.0  ?LYMPHSABS 1.5  --   ?MONOABS 0.5  --   ?EOSABS 0.3  --   ?BASOSABS 0.0  --   ? ? ? ?Recent Labs  ?Lab 07/25/21 ?1405 07/26/21 ?0227  ?NA 137 138  ?K 3.4* 3.9  ?CL 104 108  ?CO2 23 25  ?GLUCOSE 85 87  ?BUN 8 10  ?CREATININE 0.64 0.63   ?CALCIUM 9.0 9.2  ?AST  --  16  ?ALT  --  10  ?ALKPHOS  --  42  ?BILITOT  --  0.6  ?ALBUMIN  --  3.6  ?INR  --  1.2  ?TSH  --  1.605  ? ? ? ?------------------------------------------------------------------------------------------------------------------ ?No results for input(s): CHOL, HDL, LDLCALC, TRIG, CHOLHDL, LDLDIRECT in the last 72 hours. ? ?Lab Results  ?Component Value Date  ? HGBA1C 5.5 12/30/2020  ? ?------------------------------------------------------------------------------------------------------------------ ?No results for input(s): TSH, T4TOTAL, T3FREE, THYROIDAB in the last 72 hours. ? ?Invalid input(s): FREET3 ? ? ?Cardiac Enzymes ?No results for input(s): CKMB, TROPONINI, MYOGLOBIN in the last 168 hours. ? ?Invalid input(s): CK ?------------------------------------------------------------------------------------------------------------------ ?No results found for: BNP ? ?CBG: ?No results for input(s): GLUCAP in the last 168 hours. ? ?No results found for this or any previous visit (from the past 240 hour(s)).  ? ?Radiology Studies: ?No results found. ? ? ?Marzetta Board, MD, PhD ?[facility] ? ?Between 7 am - 7 pm I am available, please contact me via Amion (for emergencies) or Securechat (non urgent messages) ? ?Between 7 pm - 7 am I am not available, please contact night coverage MD/APP via Amion ? ?

## 2021-07-29 NOTE — Progress Notes (Signed)
Physical Therapy Treatment ?Patient Details ?Name: Elizabeth Tran ?MRN: [id number] ?DOB: December 25, 2000 ?Today's Date: 07/29/2021 ? ? ?History of Present Illness 21 year old female with history of depression, anxiety, bipolar disorder who comes to the hospital with gradual lower extremity weakness, poor balance, few falls, going on for several weeks but progressively getting worse. Imaging revealed pt with Multiple Sclerosis, new diagnosis for patient. ? ?  ?PT Comments  ? ? Pt reports significant improvement in L LE sensation, R LE also improved but remains weaker and with continuous numbness compared to L. Focused on ambulation with SPC, better with cane in the L hand. Began stair negotiation as well. Pt able to tolerate some LE exercises however reports feeling "Intense" at quads. Acute PT to continue to follow. ?   ?Recommendations for follow up therapy are one component of a multi-disciplinary discharge planning process, led by the attending physician.  Recommendations may be updated based on patient status, additional functional criteria and insurance authorization. ? ?Follow Up Recommendations ? Outpatient PT ?  ?  ?Assistance Recommended at Discharge Frequent or constant Supervision/Assistance  ?Patient can return home with the following A little help with walking and/or transfers;A little help with bathing/dressing/bathroom;Assist for transportation;Help with stairs or ramp for entrance ?  ?Equipment Recommendations ? Rolling walker (2 wheels)  ?  ?Recommendations for Other Services   ? ? ?  ?Precautions / Restrictions Precautions ?Precautions: Fall ?Restrictions ?Weight Bearing Restrictions: No  ?  ? ?Mobility ? Bed Mobility ?Overal bed mobility: Needs Assistance ?Bed Mobility: Supine to Sit ?  ?  ?Supine to sit: Modified independent (Device/Increase time) ?  ?  ?General bed mobility comments: HOB all the way elevated ?  ? ?Transfers ?Overall transfer level: Needs assistance ?Equipment used: None ?Transfers: Sit  to/from Stand ?Sit to Stand: Min guard ?  ?  ?  ?  ?  ?General transfer comment: verbal cues for hand placement, increased time, mild instability, given SPC ?  ? ?Ambulation/Gait ?Ambulation/Gait assistance: Min assist ?Gait Distance (Feet): 75 Feet (x2) ?Assistive device: Rolling walker (2 wheels) ?Gait Pattern/deviations: Step-through pattern, Decreased stride length, Ataxic ?Gait velocity: dec ?Gait velocity interpretation: <1.31 ft/sec, indicative of household ambulator ?  ?General Gait Details: focused on amb with cane, first bout pt sequenced with cane in R hand however unsteady and difficulty sequencing, second bout pt used cane in L UE as pt's R LE weaker than L. Pt with improved ability to sequence cane, increased stability, and more fluid gait pattern. Pt wtih quicker onset of fatigue with use of cane today compared to use of RW yesterday ? ? ?Stairs ?Stairs: Yes ?Stairs assistance: Min guard, Min assist ?Stair Management: One rail Left, Step to pattern, Forwards, With cane ?Number of Stairs: 2 (x3 sets) ?General stair comments: verbal cues for sequencing initially however pt with good carry over, step to pattern with increased time, educated on "up with the good, down with the bad" pt with good sequencing of cane, used L handrail when going up to mimic home set up and then cane in the R, reversed when descending, pt with good carry over ? ? ?Wheelchair Mobility ?  ? ?Modified Rankin (Stroke Patients Only) ?  ? ? ?  ?Balance Overall balance assessment: Mild deficits observed, not formally tested ?Sitting-balance support: No upper extremity supported, Feet supported ?Sitting balance-Leahy Scale: Good ?  ?  ?Standing balance support: No upper extremity supported, During functional activity ?Standing balance-Leahy Scale: Fair ?Standing balance comment: pt able to wash hands at the  sink without leaning against sink ?  ?  ?  ?  ?  ?  ?  ?  ?  ?  ?  ?  ? ?  ?Cognition Arousal/Alertness: Awake/alert ?Behavior  During Therapy: [facility] for tasks assessed/performed ?Overall Cognitive Status: Within Functional Limits for tasks assessed ?  ?  ?  ?  ?  ?  ?  ?  ?  ?  ?  ?  ?  ?  ?  ?  ?General Comments: per chart pt with psych history, bipolar, depression and PTSD, pt cooperative and eager to participate it therapy, mildly flat however pt just received news of MS ?  ?  ? ?  ?Exercises General Exercises - Lower Extremity ?Long Arc Quad: AROM, Both, 10 reps, Seated (5 against gravity,  against lightest theraband) ?Heel Slides: AROM, Both, 10 reps, Seated (against lightest theraband) ?Hip ABduction/ADduction: AROM, Both, 10 reps, Seated (against lightest theraband) ? ?  ?General Comments General comments (skin integrity, edema, etc.): VSS ?  ?  ? ?Pertinent Vitals/Pain Pain Assessment ?Pain Assessment: No/denies pain  ? ? ?Home Living   ?  ?  ?  ?  ?  ?  ?  ?  ?  ?   ?  ?Prior Function    ?  ?  ?   ? ?PT Goals (current goals can now be found in the care plan section) Acute Rehab PT Goals ?Patient Stated Goal: get better; wants to be able to graduate ?PT Goal Formulation: With patient ?Time For Goal Achievement: 08/09/21 ?Potential to Achieve Goals: Good ?Progress towards PT goals: Progressing toward goals ? ?  ?Frequency ? ? ? Min 4X/week ? ? ? ?  ?PT Plan Discharge plan needs to be updated;Current plan remains appropriate  ? ? ?Co-evaluation   ?  ?  ?  ?  ? ?  ?AM-PAC PT "6 Clicks" Mobility   ?Outcome Measure ? Help needed turning from your back to your side while in a flat bed without using bedrails?: None ?Help needed moving from lying on your back to sitting on the side of a flat bed without using bedrails?: None ?Help needed moving to and from a bed to a chair (including a wheelchair)?: A Little ?Help needed standing up from a chair using your arms (e.g., wheelchair or bedside chair)?: A Little ?Help needed to walk in hospital room?: A Little ?Help needed climbing 3-5 steps with a railing? : A Little ?6 Click Score: 20 ? ?  ?End  of Session Equipment Utilized During Treatment: Gait belt ?Activity Tolerance: Patient tolerated treatment well ?Patient left: in chair;with call bell/phone within reach;with chair alarm set ?Nurse Communication: Mobility status ?PT Visit Diagnosis: Unsteadiness on feet (R26.81);Muscle weakness (generalized) (M62.81);Difficulty in walking, not elsewhere classified (R26.2) ?  ? ? ?Time: 5638-7564 ?PT Time Calculation (min) (ACUTE ONLY): 32 min ? ?Charges:  $Gait Training: 8-22 mins ?$Therapeutic Exercise: 8-22 mins          ?          ? ?Lewis Shock, PT, DPT ?Acute Rehabilitation Services ?Secure chat preferred ?Office #: [phone] ? ? ? ?Nichola Cieslinski M Tayanna Talford ?07/29/2021, 10:04 AM ? ?

## 2021-07-29 NOTE — Progress Notes (Signed)
Occupational Therapy Treatment ?Patient Details ?Name: Elizabeth Tran ?MRN: [id number] ?DOB: Apr 26, 2001 ?Today's Date: 07/29/2021 ? ? ?History of present illness 21 year old female with history of depression, anxiety, bipolar disorder who comes to the hospital with gradual lower extremity weakness, poor balance, few falls, going on for several weeks but progressively getting worse. Imaging revealed pt with Multiple Sclerosis, new diagnosis for patient. ?  ?OT comments ? Focus of session on education regarding tub transfers and compensatory strategies to reduce risk of falls. Pt very concerned about falling due to BLE weakness/ numbness. Will follow up prior to DC to maximize safety to facilitate safe DC home. Discussed recommendation of shower seat with Mom.   ? ?Recommendations for follow up therapy are one component of a multi-disciplinary discharge planning process, led by the attending physician.  Recommendations may be updated based on patient status, additional functional criteria and insurance authorization. ?   ?Follow Up Recommendations ? No OT follow up  ?  ?Assistance Recommended at Discharge PRN  ?Patient can return home with the following ? Help with stairs or ramp for entrance;Assist for transportation ?  ?Equipment Recommendations ? Tub/shower seat (Discussed with Mom)  ?  ?Recommendations for Other Services   ? ?  ?Precautions / Restrictions Precautions ?Precautions: Fall ?Restrictions ?Weight Bearing Restrictions: No  ? ? ?  ? ?Mobility Bed Mobility ?  ?  ?  ?  ?  ?  ?  ?General bed mobility comments: OOB in chair ?  ? ?Transfers ?Overall transfer level: Needs assistance ?Equipment used: None ?Transfers: Sit to/from Stand ?Sit to Stand: Min guard ?  ?  ?  ?  ?  ?General transfer comment: verbal cues for hand placement, increased time, mild instability, given SPC ?  ?  ?Balance Overall balance assessment: Mild deficits observed, not formally tested ?Sitting-balance support: No upper extremity supported,  Feet supported ?Sitting balance-Leahy Scale: Good ?  ?  ?Standing balance support: No upper extremity supported, During functional activity ?Standing balance-Leahy Scale: Fair ?  ?  ?  ?  ?  ?  ?  ?  ?  ?  ?  ?  ?   ? ?ADL either performed or assessed with clinical judgement  ? ?ADL Overall ADL's : Needs assistance/impaired ?  ?  ?  ?  ?  ?  ?  ?  ?  ?  ?  ?  ?  ?  ?  ?  ?  ?  ?Functional mobility during ADLs: Min guard;Cueing for safety ?General ADL Comments: Ambulated with use of [place]. Educated pt on tub trasnfer technique using cane adn shower chair. Pt feels very uncomfortable with single leg stance when stepping over tub therefore pt practiced sitting on chair then swinging legs over tub - would benefit fomr chair without a back. Educated on need to complete bathing/dressing when sitting to reduce risk of falls. Pt verbalized understanding. ?  ? ?Extremity/Trunk Assessment Upper Extremity Assessment ?Upper Extremity Assessment: Overall WFL for tasks assessed ?  ?Lower Extremity Assessment ?Lower Extremity Assessment: Defer to PT evaluation ?  ?  ?  ? ?Vision   ?Vision Assessment?: No apparent visual deficits ?  ?Perception   ?  ?Praxis   ?  ? ?Cognition Arousal/Alertness: Awake/alert ?Behavior During Therapy: [facility] for tasks assessed/performed ?Overall Cognitive Status: Within Functional Limits for tasks assessed ?  ?  ?  ?  ?  ?  ?  ?  ?  ?  ?  ?  ?  ?  ?  ?  ?  General Comments: per chart pt with psych history, bipolar, depression and PTSD, pt cooperative and eager to participate it therapy ?  ?  ?   ?Exercises General Exercises - Lower Extremity ?Long Arc Quad:  (5 against gravity,  against lightest theraband) ?Heel Slides:  (against lightest theraband) ?Hip ABduction/ADduction:  (against lightest theraband) ?Other Exercises ?Other Exercises: sit - stand x 5 ? ?  ?Shoulder Instructions   ? ? ?  ?General Comments VSS  ? ? ?Pertinent Vitals/ Pain         ? ?Home Living   ?  ?  ?  ?  ?  ?  ?  ?  ?  ?  ?  ?  ?  ?  ?   ?  ?  ?  ? ?  ?Prior Functioning/Environment    ?  ?  ?  ?   ? ?Frequency ? Min 2X/week  ? ? ? ? ?  ?Progress Toward Goals ? ?OT Goals(current goals can now be found in the care plan section) ? Progress towards OT goals: Progressing toward goals ? ?Acute Rehab OT Goals ?Patient Stated Goal: to not fall ?OT Goal Formulation: With patient ?Time For Goal Achievement: 08/10/21 ?Potential to Achieve Goals: Good ?ADL Goals ?Pt Will Perform Tub/Shower Transfer: with modified independence;ambulating;shower seat ?Additional ADL Goal #3: Pt will independently demosntrate ability to stand from floor  ?Plan Discharge plan remains appropriate   ? ?Co-evaluation ? ? ?   ?  ?  ?  ?  ? ?  ?AM-PAC OT "6 Clicks" Daily Activity     ?Outcome Measure ? ? Help from another person eating meals?: None ?Help from another person taking care of personal grooming?: A Little ?Help from another person toileting, which includes using toliet, bedpan, or urinal?: A Little ?Help from another person bathing (including washing, rinsing, drying)?: A Little ?Help from another person to put on and taking off regular upper body clothing?: A Little ?Help from another person to put on and taking off regular lower body clothing?: A Little ?6 Click Score: 19 ? ?  ?End of Session Equipment Utilized During Treatment: Gait belt (straight cane) ? ?OT Visit Diagnosis: Unsteadiness on feet (R26.81);Other abnormalities of gait and mobility (R26.89);Muscle weakness (generalized) (M62.81) ?  ?Activity Tolerance Patient tolerated treatment well ?  ?Patient Left in bed;with call bell/phone within reach;with nursing/sitter in room;with family/visitor present ?  ?Nurse Communication Mobility status;Other (comment) (DC needs) ?  ? ?   ? ?Time: [id number] ?OT Time Calculation (min): 44 min ? ?Charges: OT General Charges ?$OT Visit: 1 Visit ?OT Treatments ?$Self Care/Home Management : 38-52 mins ? ?[facility], OT/L  ? ?Acute OT Clinical Specialist ?Acute Rehabilitation  Services ?Pager [phone] ?Office [phone]  ? ?Maniyah Moller,HILLARY ?07/29/2021, 11:33 AM ?

## 2021-07-30 DIAGNOSIS — E876 Hypokalemia: Secondary | ICD-10-CM

## 2021-07-30 DIAGNOSIS — F322 Major depressive disorder, single episode, severe without psychotic features: Secondary | ICD-10-CM

## 2021-07-30 DIAGNOSIS — D649 Anemia, unspecified: Secondary | ICD-10-CM

## 2021-07-30 LAB — JC VIRUS DNA,PCR (WHOLE BLOOD): JC Virus DNA, PCR, Blood: NEGATIVE

## 2021-07-30 MED ORDER — VITAMIN D (ERGOCALCIFEROL) 1.25 MG (50000 UNIT) PO CAPS
50000.0000 [IU] | ORAL_CAPSULE | ORAL | 0 refills | Status: DC
Start: 2021-08-03 — End: 2021-08-25

## 2021-07-30 MED ORDER — MECLIZINE HCL 25 MG PO TABS: 25.0000 mg | ORAL_TABLET | Freq: Three times a day (TID) | ORAL | 0 refills | Status: AC | PRN

## 2021-07-30 MED ORDER — FERROUS SULFATE 300 (60 FE) MG/5ML PO SYRP
220.0000 mg | ORAL_SOLUTION | Freq: Every day | ORAL | 3 refills | Status: AC
Start: 2021-07-31 — End: ?

## 2021-07-30 NOTE — Progress Notes (Signed)
Occupational Therapy Treatment ?Patient Details ?Name: Elizabeth Tran ?MRN: [id number] ?DOB: 2000-06-13 ?Today's Date: 07/30/2021 ? ? ?History of present illness 21 year old female with history of depression, anxiety, bipolar disorder who comes to the hospital with gradual lower extremity weakness, poor balance, few falls, going on for several weeks but progressively getting worse. Imaging revealed pt with Multiple Sclerosis, new diagnosis for patient. ?  ?OT comments ? Focus of session on tub transfer technique using shower chair and pt very anxious about falling during yesterday's session. Completed with S. Will review with pt's boyfriend once he arrives. Will continue to follow.  ? ?Recommendations for follow up therapy are one component of a multi-disciplinary discharge planning process, led by the attending physician.  Recommendations may be updated based on patient status, additional functional criteria and insurance authorization. ?   ?Follow Up Recommendations ? No OT follow up  ?  ?Assistance Recommended at Discharge PRN  ?Patient can return home with the following ? Help with stairs or ramp for entrance;Assist for transportation ?  ?Equipment Recommendations ? Tub/shower seat  ?  ?Recommendations for Other Services   ? ?  ?Precautions / Restrictions Precautions ?Precautions: Fall  ? ? ?  ? ?Mobility Bed Mobility ?Overal bed mobility: Modified Independent ?  ?  ?  ?  ?  ?  ?  ?  ? ?Transfers ?Overall transfer level: Needs assistance ?  ?  ?Sit to Stand: Supervision (cane) ?  ?  ?  ?  ?  ?  ?  ?  ?Balance   ?  ?Sitting balance-Leahy Scale: Good ?  ?  ?  ?Standing balance-Leahy Scale: Fair ?  ?  ?  ?  ?  ?  ?  ?  ?  ?  ?  ?  ?   ? ?ADL either performed or assessed with clinical judgement  ? ?ADL Overall ADL's : Needs assistance/impaired ?  ?  ?  ?  ?  ?  ?  ?  ?  ?  ?  ?  ?  ?  ?  ?  ?  ?  ?Functional mobility during ADLs: Supervision/safety;Cane ?General ADL Comments: REviewed tub transfer technique with use of  shower chair. Spoke to Mom over the phone adn reviewed recommendations ?  ? ?Extremity/Trunk Assessment Upper Extremity Assessment ?Upper Extremity Assessment: Overall WFL for tasks assessed ?  ?Lower Extremity Assessment ?Lower Extremity Assessment: Defer to PT evaluation ?  ?  ?  ? ?Vision   ?  ?  ?Perception   ?  ?Praxis   ?  ? ?Cognition Arousal/Alertness: Awake/alert ?Behavior During Therapy: Flat affect ?Overall Cognitive Status: Within Functional Limits for tasks assessed (delayed processing and problem solving; appear to be related to anxiety) ?  ?  ?  ?  ?  ?  ?  ?  ?  ?  ?  ?  ?  ?  ?  ?  ?  ?  ?  ?   ?Exercises Exercises: Other exercises ?Other Exercises ?Other Exercises: sit - stand x 5; R bias ? ?  ?Shoulder Instructions   ? ? ?  ?General Comments    ? ? ?Pertinent Vitals/ Pain       Pain Assessment ?Pain Assessment: No/denies pain ? ?Home Living   ?  ?  ?  ?  ?  ?  ?  ?  ?  ?  ?  ?  ?  ?  ?  ?  ?  ?  ? ?  ?  Prior Functioning/Environment    ?  ?  ?  ?   ? ?Frequency ? Min 2X/week  ? ? ? ? ?  ?Progress Toward Goals ? ?OT Goals(current goals can now be found in the care plan section) ? Progress towards OT goals: Progressing toward goals ? ?Acute Rehab OT Goals ?Patient Stated Goal: to not fall ?OT Goal Formulation: With patient ?Time For Goal Achievement: 08/10/21 ?Potential to Achieve Goals: Good ?ADL Goals ?Pt Will Perform Tub/Shower Transfer: with modified independence;ambulating;shower seat ?Additional ADL Goal #1: Pt will maintain dynamic standing balance, independently, during 5 mins of functional tasks. ?Additional ADL Goal #2: Pt will complete core exercises independently with HEP. ?Additional ADL Goal #3: Pt will independently demosntrate ability to stand from floor  ?Plan Discharge plan remains appropriate   ? ?Co-evaluation ? ? ?   ?  ?  ?  ?  ? ?  ?AM-PAC OT "6 Clicks" Daily Activity     ?Outcome Measure ? ? Help from another person eating meals?: None ?Help from another person taking care of  personal grooming?: A Little ?Help from another person toileting, which includes using toliet, bedpan, or urinal?: A Little ?Help from another person bathing (including washing, rinsing, drying)?: A Little ?Help from another person to put on and taking off regular upper body clothing?: A Little ?Help from another person to put on and taking off regular lower body clothing?: A Little ?6 Click Score: 19 ? ?  ?End of Session Equipment Utilized During Treatment: Gait belt (cane) ? ?OT Visit Diagnosis: Unsteadiness on feet (R26.81);Other abnormalities of gait and mobility (R26.89);Muscle weakness (generalized) (M62.81) ?  ?Activity Tolerance Patient tolerated treatment well ?  ?Patient Left in chair;with call bell/phone within reach ?  ?Nurse Communication Mobility status ?  ? ?   ? ?Time: 9622-2979 ?OT Time Calculation (min): 18 min ? ?Charges: OT General Charges ?$OT Visit: 1 Visit ?OT Treatments ?$Self Care/Home Management : 8-22 mins ? ? [facility], OT/L  ? ?Acute OT Clinical Specialist ?Acute Rehabilitation Services ?Pager [phone] ?Office [phone]  ? ?Estelita Iten,HILLARY ?07/30/2021, 10:44 AM ?

## 2021-07-30 NOTE — TOC Transition Note (Signed)
Transition of Care (TOC) - CM/SW Discharge Note ? ? ?Patient Details  ?Name: Shellby Schlink ?MRN: [id number] ?Date of Birth: 2000/06/11 ? ?Transition of Care (TOC) CM/SW Contact:  ?Kermit Balo, RN ?Phone Number: ?07/30/2021, 4:43 PM ? ? ?Clinical Narrative:    ?Patient is discharging home with outpatient therapy. Information on the AVS.  ?Pt has transportation home.  ? ? ?Final next level of care: OP Rehab ?Barriers to Discharge: No Barriers Identified ? ? ?Patient Goals and CMS Choice ?  ?  ?Choice offered to / list presented to : Patient ? ?Discharge Placement ?  ?           ?  ?  ?  ?  ? ?Discharge Plan and Services ?  ?Discharge Planning Services: CM Consult ?           ?  ?  ?  ?  ?  ?  ?  ?  ?  ?  ? ?Social Determinants of Health (SDOH) Interventions ?  ? ? ?Readmission Risk Interventions ?   ? View : No data to display.  ?  ?  ?  ? ? ? ? ? ?

## 2021-07-30 NOTE — Progress Notes (Addendum)
Occupational Therapy Treatment ?Patient Details ?Name: Elizabeth Tran ?MRN: [id number] ?DOB: [birth year] ?Today's Date: 07/30/2021 ? ? ?History of present illness 21 year old female with history of depression, anxiety, bipolar disorder who comes to the hospital with gradual lower extremity weakness, poor balance, few falls, going on for several weeks but progressively getting worse. Imaging revealed pt with Multiple Sclerosis, new diagnosis for patient. ?  ?OT comments ? Completed ADL session while educating pt on energy conservation and strategies to reduce risk of falls. Pt appears anxious about situation and requires increased time for processing. Boyfriend educated on how to assist with ADL tasks, including tub transfers. Pt very appreciative.   ? ?Recommendations for follow up therapy are one component of a multi-disciplinary discharge planning process, led by the attending physician.  Recommendations may be updated based on patient status, additional functional criteria and insurance authorization. ?   ?Follow Up Recommendations ? No OT follow up  ?  ?Assistance Recommended at Discharge Set up Supervision/Assistance  ?Patient can return home with the following ? A little help with bathing/dressing/bathroom;Direct supervision/assist for medications management;Assist for transportation;Assistance with cooking/housework ?  ?Equipment Recommendations ? Tub/shower seat  ?  ?Recommendations for Other Services   ? ?  ?Precautions / Restrictions Precautions ?Precautions: Fall  ? ? ?  ? ?Mobility Bed Mobility ?Overal bed mobility: Modified Independent ?  ?  ?  ?  ?  ?  ?  ?  ? ?Transfers ?Overall transfer level: Needs assistance ?Equipment used: Straight cane ?  ?Sit to Stand: Supervision ?  ?  ?  ?  ?  ?  ?  ?  ?Balance   ?  ?Sitting balance-Leahy Scale: Good ?  ?  ?  ?Standing balance-Leahy Scale: Fair ?  ?  ?  ?  ?  ?  ?  ?  ?  ?  ?  ?  ?   ? ?ADL either performed or assessed with clinical judgement  ? ?ADL Overall  ADL's : Needs assistance/impaired ?  ?  ?  ?  ?  ?  ?  ?  ?  ?  ?  ?  ?  ?  ?  ?  ?Tub/ Shower Transfer: Supervision/safety;Shower seat;Ambulation (cane; reviewed with boyfirend in gym) ?  ?Functional mobility during ADLs: Supervision/safety;Cane ?General ADL Comments: Educated pt on energy conservation techniques and strategies to reduce risk of falls; pt completed ADL during session adn demonstrated delayed processing; feel this is related to anxiety and feeling "over whelmed" ?  ? ?Extremity/Trunk Assessment Upper Extremity Assessment ?Upper Extremity Assessment: Overall WFL for tasks assessed ?  ?Lower Extremity Assessment ?Lower Extremity Assessment: Defer to PT evaluation ?  ?  ?  ? ?Vision   ?  ?  ?Perception   ?  ?Praxis   ?  ? ?Cognition Arousal/Alertness: Awake/alert ?Behavior During Therapy: Flat affect ?Overall Cognitive Status: Within Functional Limits for tasks assessed ?  ?  ?  ?  ?  ?  ?  ?  ?  ?  ?  ?  ?  ?  ?  ?  ?General Comments: slow processign adn problem sloving; talked about coping with the new diagnosis of MS and conerns about finishing school ?  ?  ?   ?Exercises Exercises: Other exercises ?Other Exercises ?Other Exercises: sit - stand x 5; R bias ? ?  ?Shoulder Instructions   ? ? ?  ?General Comments    ? ? ?Pertinent Vitals/ Pain       Pain  Assessment ?Pain Assessment: No/denies pain ? ?Home Living   ?  ?  ?  ?  ?  ?  ?  ?  ?  ?  ?  ?  ?  ?  ?  ?  ?  ?  ? ?  ?Prior Functioning/Environment    ?  ?  ?  ?   ? ?Frequency ? Min 2X/week  ? ? ? ? ?  ?Progress Toward Goals ? ?OT Goals(current goals can now be found in the care plan section) ? Progress towards OT goals: Goals met/education completed, patient discharged from OT ? ?Acute Rehab OT Goals ?Patient Stated Goal: to not fall ?OT Goal Formulation: With patient ?Time For Goal Achievement: 08/10/21 ?Potential to Achieve Goals: Good ?ADL Goals ?Pt Will Perform Tub/Shower Transfer: with modified independence;ambulating;shower seat ?Additional  ADL Goal #1: Pt will maintain dynamic standing balance, independently, during 5 mins of functional tasks. ?Additional ADL Goal #2: Pt will complete core exercises independently with HEP. ?Additional ADL Goal #3: Pt will independently demosntrate ability to stand from floor  ?Plan All goals met and education completed, patient discharged from OT services   ? ?Co-evaluation ? ? ?   ?  ?  ?  ?  ? ?  ?AM-PAC OT "6 Clicks" Daily Activity     ?Outcome Measure ? ? Help from another person eating meals?: None ?Help from another person taking care of personal grooming?: A Little ?Help from another person toileting, which includes using toliet, bedpan, or urinal?: A Little ?Help from another person bathing (including washing, rinsing, drying)?: A Little ?Help from another person to put on and taking off regular upper body clothing?: A Little ?Help from another person to put on and taking off regular lower body clothing?: A Little ?6 Click Score: 19 ? ?  ?End of Session Equipment Utilized During Treatment: Gait belt (cane) ? ?OT Visit Diagnosis: Unsteadiness on feet (R26.81);Other abnormalities of gait and mobility (R26.89);Muscle weakness (generalized) (M62.81) ?  ?Activity Tolerance Patient tolerated treatment well ?  ?Patient Left in bed;with call bell/phone within reach;with nursing/sitter in room;with family/visitor present ?  ?Nurse Communication Other (comment) (DC needs) ?  ? ?   ? ?Time: 1005-1036 ?OT Time Calculation (min): 31 min ? ?Charges: OT General Charges ?$OT Visit: 1 Visit ?OT Treatments ?$Self Care/Home Management : 23-37 mins ? ?[facility], OT/L  ? ?Acute OT Clinical Specialist ?Acute Rehabilitation Services ?Pager [phone] ?Office [phone]  ? ?Quincey Quesinberry,HILLARY ?07/30/2021, 10:52 AM ?

## 2021-07-30 NOTE — Hospital Course (Addendum)
Patient is a 21 years old female with past medical history of depression, anxiety, bipolar disorder presented to hospital with gradual weakness of her lower extremity with poor balance and falls going on for few weeks prior to presentation.  There was some mention of numbness in her legs and abdomen.  She was evaluated as outpatient and there was some concern for multiple sclerosis so she was sent into the hospital.  In the ED patient had MRI scan which was positive of multiple sclerosis and was started on high-dose IV steroids.  Neurology was consulted and patient was then admitted to the hospital. ? ?Assessment and Plan: ? ?Multiple sclerosis.   ?New diagnosis.  MRI of the brain showed severe bilateral white matter disease suggestive of demyelinating disease.  Cervical spine and thoracic spine MRI showing demyelinating disease as well. patient has been seen by neurology during hospitalization.  Received  5-day course of high-dose steroids.  Patient has been seen by physical therapy recommend outpatient PT on discharge.  Will need to follow-up with neurology, Dr Everlena Cooper as outpatient.  JC virus panel was negative.  Ambulatory referral to neurology has been made.  Communicated with in-house neurology prior to discharge. ? ?Normocytic anemia, iron deficiency anemia- ?Continue iron on discharge.  Takes liquid iron ?  ?Depression/anxiety/bipolar- ?Was seen by psychiatry during hospitalization.  Continue Abilify and Prozac on discharge. ? ?Vitamin D deficiency- ?Patient is taking over-the-counter vitamin D at home.  Advised to take 50,000 units weekly prior to reinitiating her home doses. ?

## 2021-07-30 NOTE — Consult Note (Addendum)
NEUROLOGY CONSULTATION NOTE  ? ?Date of service: July 30, 2021 ?Patient Name: Elizabeth Tran ?MRN:  [id number] ?DOB:  [birth year] ?Reason for consult: "MS flare" ?Requesting Provider: Joycelyn Das, MD ? ?History of Present Illness  ?No acute events overnight. ? ?Elizabeth Tran was awake, alert, and engaged on evaluation.  ? ?No family at bedside, however spoke and answered all questions with mom over phone. Patient denied acute or new changes. Mom and patient had no other concerns. ?  ?ROS  ? ?Constitutional Denies weight loss, fever and chills.   ?HEENT Denies changes in vision and hearing.   ?Respiratory Denies SOB and cough.   ?CV Denies palpitations and CP   ?GI Denies abdominal pain, nausea, vomiting and diarrhea.   ?   ?MSK Denies myalgia and joint pain.   ?Skin Denies rash and pruritus.   ?Neurological Denies headache and syncope.   ?Psychiatric Denies recent changes in mood.   ? ?Past History  ?History reviewed. No pertinent past medical history. ?History reviewed. No pertinent surgical history. ?History reviewed. No pertinent family history. ?Social History  ? ?Socioeconomic History  ? Marital status: Single  ?  Spouse name: Not on file  ? Number of children: Not on file  ? Years of education: Not on file  ? Highest education level: Not on file  ?Occupational History  ? Not on file  ?Tobacco Use  ? Smoking status: Never  ?  Passive exposure: Never  ? Smokeless tobacco: Never  ?Substance and Sexual Activity  ? Alcohol use: Not Currently  ? Drug use: Yes  ?  Types: Marijuana  ?  Comment: edibles  ? Sexual activity: Not on file  ?Other Topics Concern  ? Not on file  ?Social History Narrative  ? Right handed  ? ?Social Determinants of Health  ? ?Financial Resource Strain: Not on file  ?Food Insecurity: Not on file  ?Transportation Needs: Not on file  ?Physical Activity: Not on file  ?Stress: Not on file  ?Social Connections: Not on file  ? ?No Known Allergies ? ?Medications  ? ?Medications Prior to Admission   ?Medication Sig Dispense Refill Last Dose  ? ARIPiprazole (ABILIFY) 10 MG tablet Take 10 mg by mouth daily.   07/25/2021  ? famotidine (PEPCID) 10 MG tablet Take 20 mg by mouth daily as needed for heartburn or indigestion. Pepcid Complete OTC   unk  ? FLUoxetine (PROZAC) 10 MG capsule Take 10 mg by mouth at bedtime.   07/24/2021  ?  ? ?Vitals  ? ?Vitals:  ? 07/29/21 2347 07/30/21 0329 07/30/21 0849 07/30/21 1207  ?BP: 106/72 109/74 117/80 122/81  ?Pulse:  65 63 61  ?Resp: 15 17 18 16   ?Temp: (!) 97.5 ?F (36.4 ?C) 97.8 ?F (36.6 ?C) 98 ?F (36.7 ?C) 98.3 ?F (36.8 ?C)  ?TempSrc:  Oral Oral Oral  ?SpO2:  99% 100% 99%  ?  ? ?There is no height or weight on file to calculate BMI. ? ?Physical Exam  ? ?General: Laying comfortably in bed; in no acute distress.  ?Pulmonary: Symmetric Chest rise. Normal respiratory effort.  ? ?Neurologic Examination  ?Mental status/Cognition: Alert, oriented to self, place, month and year, good attention.  ?Speech/language: Fluent, comprehension intact, object naming intact, repetition intact. Slowed speech ?Cranial nerves:  ? CN II Pupils equal and reactive to light, no VF deficits   ? CN III,IV,VI EOM intact, no gaze preference or deviation, no nystagmus   ? CN V normal sensation in V1, V2, and V3 segments  bilaterally   ? CN VII no asymmetry, no nasolabial fold flattening   ? CN VIII normal hearing to speech   ? CN IX & X normal palatal elevation, no uvular deviation   ? CN XI 5/5 head turn and 5/5 shoulder shrug bilaterally   ? CN XII midline tongue protrusion   ? ?Motor:  ?Tone normal, pronator drift absent, mild kinetic tremor.  ?Mvmt Root Nerve  Muscle Right Left Comments  ?SA C5/6 Ax Deltoid 5 5   ?EF C5/6 Mc Biceps 5 5   ?EE C6/7/8 Rad Triceps 5 5   ?WF C6/7 Med FCR 5 5   ?WE C7/8 PIN ECU 5 5   ?F Ab C8/T1 U ADM/FDI 5 5   ?HF L1/2/3 Fem Illopsoas 5 5   ?KE L2/3/4 Fem Quad 5 5   ?DF L4/5 D Peron Tib Ant 5 5   ?PF S1/2 Tibial Grc/Sol 5 5   ? ?Sensation: Intact FT bilaterally  ? ?Reflexes:  Deferred ? ?Coordination/Complex Motor:  ?- Finger to Nose: mild ataxia b/l R>L ?- Heel to shin: ataxic b/l R>L ?- Gait: Mildly wide base, slow and antalgic.  ? ?Labs  ? ?CBC:  ?Recent Labs  ?Lab 07/25/21 ?1405 07/26/21 ?0227  ?WBC 5.3 5.5  ?NEUTROABS 3.0  --   ?HGB 11.2* 10.3*  ?HCT 33.6* 30.5*  ?MCV 87.3 86.2  ?PLT 253 221  ? ? ?Basic Metabolic Panel:  ?Lab Results  ?Component Value Date  ? NA 138 07/26/2021  ? K 3.9 07/26/2021  ? CO2 25 07/26/2021  ? GLUCOSE 87 07/26/2021  ? BUN 10 07/26/2021  ? CREATININE 0.63 07/26/2021  ? CALCIUM 9.2 07/26/2021  ? GFRNONAA >60 07/26/2021  ? ?Lipid Panel:  ?Lab Results  ?Component Value Date  ? LDLCALC 96 12/30/2020  ? ?HgbA1c:  ?Lab Results  ?Component Value Date  ? HGBA1C 5.5 12/30/2020  ? ?Urine Drug Screen: No results found for: LABOPIA, COCAINSCRNUR, LABBENZ, AMPHETMU, THCU, LABBARB  ?Alcohol Level No results found for: ETH ? ?MRI Brain w wo con ?IMPRESSION: ?1. Extensive T2/FLAIR signal abnormality involving the ?supratentorial and infratentorial cerebral white matter, most ?concerning for demyelinating disease. Multiple scattered enhancing ?lesion involving the bilateral cerebral hemispheres and brainstem, ?consistent with active demyelination. ?2. No other acute intracranial abnormality. ? ?MR C-spine w wo con ?IMPRESSION: ?1. Extensive patchy signal abnormality throughout the cervical ?spinal cord, consistent with demyelinating disease. Associated ?enhancement about a lesion at the level of C6-7 consistent with ?active demyelination. ?2. Mild for age spinal stenosis at C3-4 through C6-7 without ?significant spinal stenosis. Associated mild left C6 and C7 ?foraminal narrowing. ? ?MR T-spine w wo con ?IMPRESSION: ?1. Extensive patchy signal abnormality throughout the majority of ?the thoracic spinal cord, consistent with demyelinating disease. ?Probable subtle patchy enhancement about the lesions at the levels ?T7-8 and T8-9, consistent with active demyelination. ?2. No  significant degenerative disc disease within the thoracic ?spine. No stenosis or neural impingement. ? ?Impression  ?Elizabeth Tran is a 21 y.o. female with hx depression well controlled on abilify who is referred by Dr. Everlena Cooper for emergent evaluation for sx c/f active demyelination in setting of imaging c/f new dx MS.  She describes noticing that she was leaning towards the right, felt dizzy when she was walking.  She has an ataxic gait.  The last few days she feels that her balance has been getting progressively worse.  She is also noticed new onset numbness in her bilateral lower extremities and her trunk below  the navel.  She reports urinary retention for the past few days. She reports that she feels some weakness in her right lower extremity as well and a tightness in her abdomen.She had an MRI brain with and without contrast on March 23 that showed severe white matter disease in bilateral cerebral hemispheres, right pons, right middle cerebellar peduncle without active enhancement (personal review).  The pattern is concerning for demyelinating disease.  She does not have a prior diagnosis of such.  She was referred by Dr. Everlena Cooper to the emergency department for emergent evaluation and potential treatment with solumedrol. ?- She has completed 5 days of IV Solumedrol. She states that symptomatically she feels better, but is not back to baseline. She has plateaued in terms of clinical improvement during this admission. No indication for progression to more aggressive treatment of her exacerbation.  ?- Imaging personally reviewed. The findings are most consistent with MS, with both enhancing and chronic lesions, consistent with dissemination in time and space.  ?- Exam today as documented above. She has a wide based antalgic gait, action tremor, mild ataxia R > L and slowed speech.  ? ?Recommendations  ?- Can be discharged home today from a neurology standpoint. Dr. Everlena Cooper notified. ?- Labwork prior to starting  disease-modifying tx ordered: CMP, CBC w/ diff, hepatitis panels, quantiferon gold, JC virus.  ?- She will start DMT as outpatient.   ?- Vit D supplementation and repeat outpatient ?- Iron supplementatio

## 2021-07-30 NOTE — Discharge Summary (Signed)
?Physician Discharge Summary ?  ?Patient: Elizabeth Tran MRN: [id number] DOB: [birth year]  ?Admit date:     07/25/2021  ?Discharge date: 07/30/21  ?Discharge Physician: Rebekah Chesterfield Syrena Burges  ? ?PCP: Roycroft, Barbette Hair, MD  ? ?Recommendations at discharge:  ? ?Follow-up with Dr Everlena Cooper, neurology in 1 to 2 weeks to discuss treatment for multiple sclerosis. ?Follow-up with your primary care physician in 1 to 2 weeks/as scheduled by you. ? ?Discharge Diagnoses: ?Principal Problem: ?  Multiple sclerosis (HCC) ?Active Problems: ?  Normocytic anemia ?  Hypokalemia ?  Depression ? ?Resolved Problems: ?  * No resolved hospital problems. * ? ?Hospital Course: ?Patient is a 21 years old female with past medical history of depression, anxiety, bipolar disorder presented to hospital with gradual weakness of her lower extremity with poor balance and falls going on for few weeks prior to presentation.  There was some mention of numbness in her legs and abdomen.  She was evaluated as outpatient and there was some concern for multiple sclerosis so she was sent into the hospital.  In the ED patient had MRI scan which was positive of multiple sclerosis and was started on high-dose IV steroids.  Neurology was consulted and patient was then admitted to the hospital. ? ?Assessment and Plan: ? ?Multiple sclerosis.   ?New diagnosis.  MRI of the brain showed severe bilateral white matter disease suggestive of demyelinating disease.  Cervical spine and thoracic spine MRI showing demyelinating disease as well. patient has been seen by neurology during hospitalization.  Received  5-day course of high-dose steroids.  Patient has been seen by physical therapy recommend outpatient PT on discharge.  Will need to follow-up with neurology, Dr Everlena Cooper as outpatient.  JC virus panel was negative.  Ambulatory referral to neurology has been made.  Communicated with in-house neurology prior to discharge. ? ?Normocytic anemia, iron deficiency anemia- ?Continue iron  on discharge.  Takes liquid iron ?  ?Depression/anxiety/bipolar- ?Was seen by psychiatry during hospitalization.  Continue Abilify and Prozac on discharge. ? ?Vitamin D deficiency- ?Patient is taking over-the-counter vitamin D at home.  Advised to take 50,000 units weekly prior to reinitiating her home doses. ? ?Consultants: Neurology. ?Procedures performed: None ?Disposition: Home ?Diet recommendation:  ?Discharge Diet Orders (From admission, onward)  ? ?  Start     Ordered  ? 07/30/21 0000  Diet general       ? 07/30/21 1554  ? ?  ?  ? ?  ? ?Regular diet ?DISCHARGE MEDICATION: ?Allergies as of 07/30/2021   ?No Known Allergies ?  ? ?  ?Medication List  ?  ? ?TAKE these medications   ? ?ARIPiprazole 10 MG tablet ?Commonly known as: ABILIFY ?Take 10 mg by mouth daily. ?  ?famotidine 10 MG tablet ?Commonly known as: PEPCID ?Take 20 mg by mouth daily as needed for heartburn or indigestion. Pepcid Complete OTC ?  ?ferrous sulfate 300 (60 Fe) MG/5ML syrup ?Take 3.7 mLs (220 mg total) by mouth daily with breakfast. ?Start taking on: July 31, 2021 ?  ?FLUoxetine 10 MG capsule ?Commonly known as: PROZAC ?Take 10 mg by mouth at bedtime. ?  ?meclizine 25 MG tablet ?Commonly known as: ANTIVERT ?Take 1 tablet (25 mg total) by mouth 3 (three) times daily as needed for dizziness. ?  ?Vitamin D (Ergocalciferol) 1.25 MG (50000 UNIT) Caps capsule ?Commonly known as: DRISDOL ?Take 1 capsule (50,000 Units total) by mouth every 7 (seven) days. ?Start taking on: August 03, 2021 ?  ? ?  ? ?  Follow-up Information   ? ? [facility]. Schedule an appointment as soon as possible for a visit in 1 week(s).   ?Specialty: Rehabilitation ?Contact information: ?[address] ?[id number] mc ?[place] [address] ?[phone] ? ?  ?  ? ? Roycroft, Barbette Hair, MD Follow up.   ?Specialty: Pediatrics ?Contact information: ?[address] ?[place] [address] ?[phone] ? ? ?  ?  ? ? Drema Dallas, DO. Schedule an appointment as soon as possible for a visit in 1 week(s).   ?Specialty: Neurology ?Contact information: ?[address] ?[address] ?[place] [address] ?[phone] ? ? ?  ?  ? ?  ?  ? ?  ? ?Subjective. ?Today, patient was seen and examined at bedside.  Patient denies any fever chills nausea vomiting abdominal pain.  Had mild dizziness.  Worked with PT OT. ? ?Discharge Exam: ? ?  07/30/2021  ? 12:07 PM 07/30/2021  ?  8:49 AM 07/30/2021  ?  3:29 AM  ?Vitals with BMI  ?Systolic 122 117 937  ?Diastolic 81 80 74  ?Pulse 61 63 65  ?  ?There is no height or weight on file to calculate BMI.  ?General:  Average built, not in obvious distress ?HENT:   No scleral pallor or icterus noted. Oral mucosa is moist.  ?Chest:  Clear breath sounds.  Diminished breath sounds bilaterally. No crackles or wheezes.  ?CVS: S1 &S2 heard. No murmur.  Regular rate and rhythm. ?Abdomen: Soft, nontender, nondistended.  Bowel sounds are heard.   ?Extremities: No cyanosis, clubbing or edema.  Peripheral pulses are palpable. ?Psych: Alert, awake and oriented, normal mood ?CNS:  No cranial nerve deficits.  Moves all extremities. ?Skin: Warm and dry.  No rashes noted. ? ?Condition at discharge: good ? ?The results of significant diagnostics from this hospitalization (including imaging, microbiology, ancillary and laboratory) are listed below for reference.  ? ?Imaging Studies: ?MR BRAIN W WO CONTRAST ? ?Result Date: 07/25/2021 ?CLINICAL DATA:  Initial evaluation for demyelinating disease, worsening ataxia. EXAM: MRI HEAD WITHOUT AND WITH CONTRAST TECHNIQUE: Multiplanar, multiecho pulse sequences of the brain and surrounding structures were obtained without and with intravenous contrast. CONTRAST:  74mL GADAVIST GADOBUTROL 1 MMOL/ML IV SOLN COMPARISON:  Brain MRI from 07/17/2021. FINDINGS: Brain: Cerebral volume within normal limits for age. No evidence for acute or subacute infarct. Again seen is extensive patchy and  confluent T2/FLAIR signal abnormality involving the periventricular, deep, and juxta cortical white matter both cerebral hemispheres. Extensive patchy involvement of the deep white matter tracts, brainstem, and cerebellum. Overall appearance is most concerning for demyelinating disease. Multiple scattered enhancing lesion seen involving the bilateral cerebral hemispheres and brainstem, consistent with active demyelination. For reference purposes the, the most prominent of these is seen at the right ventral pons and measures 1 cm (series 18, image 15). No mass lesion, midline shift, or significant regional mass effect. No hydrocephalus or extra-axial fluid collection. Pituitary gland within normal limits for age. Midline structures intact and normal. No other pathologic enhancement. Vascular: Major intracranial vascular flow voids are well maintained. Skull and upper cervical spine: Craniocervical junction within normal limits. Bone marrow signal intensity diffusely decreased on T1 weighted imaging, which could be within normal limits for age. No focal marrow replacing lesion. No scalp soft tissue abnormality. Sinuses/Orbits: Globes orbital soft tissues demonstrate no acute finding. Paranasal sinuses are largely clear. No mastoid effusion. Other: None. IMPRESSION: 1. Extensive T2/FLAIR signal abnormality involving the supratentorial and infratentorial  cerebral white matter, most concerning for demyelinating disease. Multiple scattered enhancing lesion involving the bilateral cerebral hemispheres and brainstem, consistent with active demyelination. 2. No other acute intracranial abnormality. Electronically Signed   By: Rise Mu M.D.   On: 07/25/2021 21:31  ? ?MR BRAIN W WO CONTRAST ? ?Result Date: 07/20/2021 ?CLINICAL DATA:  Ataxia and dizziness EXAM: MRI HEAD WITHOUT AND WITH CONTRAST TECHNIQUE: Multiplanar, multiecho pulse sequences of the brain and surrounding structures were obtained without and with  intravenous contrast. CONTRAST:  9.40mL GADAVIST GADOBUTROL 1 MMOL/ML IV SOLN COMPARISON:  None. FINDINGS: Brain: No acute infarct, mass effect or extra-axial collection. No acute or chronic hemorrhage. There is

## 2021-07-30 NOTE — Progress Notes (Signed)
Physical Therapy Treatment ?Patient Details ?Name: Elizabeth Tran ?MRN: [id number] ?DOB: 08/24/2000 ?Today's Date: 07/30/2021 ? ? ?History of Present Illness Pt is 21 year old female with history of depression, anxiety, bipolar disorder who comes to the hospital on 07/25/21 with gradual lower extremity weakness, poor balance, few falls, going on for several weeks but progressively getting worse. Imaging revealed pt with Multiple Sclerosis, new diagnosis for patient. ? ?  ?PT Comments  ? ? Pt making gradual progress.  Demonstrating improved stability with cane (pt prefers using in R hand) and was able to perform stairs similar to home set up (did flight with 1 rail and cane).  Continue to progress as able and recommend outpt PT.    ?Recommendations for follow up therapy are one component of a multi-disciplinary discharge planning process, led by the attending physician.  Recommendations may be updated based on patient status, additional functional criteria and insurance authorization. ? ?Follow Up Recommendations ? Outpatient PT ?  ?  ?Assistance Recommended at Discharge Frequent or constant Supervision/Assistance  ?Patient can return home with the following A little help with walking and/or transfers;A little help with bathing/dressing/bathroom;Assist for transportation;Help with stairs or ramp for entrance ?  ?Equipment Recommendations ? Rolling walker (2 wheels)  ?  ?Recommendations for Other Services   ? ? ?  ?Precautions / Restrictions Precautions ?Precautions: Fall  ?  ? ?Mobility ? Bed Mobility ?Overal bed mobility: Modified Independent ?Bed Mobility: Supine to Sit, Sit to Supine ?  ?  ?Supine to sit: Modified independent (Device/Increase time) ?Sit to supine: Modified independent (Device/Increase time) ?  ?  ?  ? ?Transfers ?Overall transfer level: Needs assistance ?Equipment used: Straight cane ?Transfers: Sit to/from Stand ?Sit to Stand: Supervision ?  ?  ?  ?  ?  ?  ?  ? ?Ambulation/Gait ?Ambulation/Gait  assistance: Min guard, Supervision ?Gait Distance (Feet): 300 Feet ?Assistive device: Straight cane ?Gait Pattern/deviations: Step-through pattern ?Gait velocity: dec ?  ?  ?General Gait Details: Min guard progressing to supervision.  Tried with cane in R and L hand.  Sequencing slightly better in L hand but stable with both.  Pt states feels more natural on the right. ? ? ?Stairs ?Stairs: Yes ?Stairs assistance: Min guard ?Stair Management: One rail Left, Step to pattern, Forwards, With cane ?Number of Stairs: 12 (5 then 12) ?General stair comments: Started with stairs in neuro gym - performed 5 with rail on L and cane on R.  Progressed to flight of steps with rail on L and cane on R.  Cued pt to switch hands with cane for coming down (cane L , rail R) to simulate home environment.  Pt performed with step to pattern (up with good (L) and down with bad (r)) ? ? ?Wheelchair Mobility ?  ? ?Modified Rankin (Stroke Patients Only) ?  ? ? ?  ?Balance Overall balance assessment: Needs assistance ?Sitting-balance support: No upper extremity supported, Feet supported ?Sitting balance-Leahy Scale: Good ?  ?  ?Standing balance support: No upper extremity supported, During functional activity, Single extremity supported ?Standing balance-Leahy Scale: Fair ?Standing balance comment: cane to ambulate but could static stand without AD ?  ?  ?  ?  ?  ?  ?  ?  ?  ?  ?  ?  ? ?  ?Cognition Arousal/Alertness: Awake/alert ?Behavior During Therapy: Flat affect ?Overall Cognitive Status: Within Functional Limits for tasks assessed ?  ?  ?  ?  ?  ?  ?  ?  ?  ?  ?  ?  ?  ?  ?  ?  ?  General Comments: eager to go home ?  ?  ? ?  ?Exercises   ? ?  ?General Comments General comments (skin integrity, edema, etc.): Educated on energy conservation and safety.  Educated family on using gait belt for stairs and positions for guarding on stairs.  Also, discussed keeping temperature cool as heat could exacerbate fatigue/MS symptoms. ?  ?  ? ?Pertinent  Vitals/Pain Pain Assessment ?Pain Assessment: No/denies pain  ? ? ?Home Living   ?  ?  ?  ?  ?  ?  ?  ?  ?  ?   ?  ?Prior Function    ?  ?  ?   ? ?PT Goals (current goals can now be found in the care plan section) Progress towards PT goals: Progressing toward goals ? ?  ?Frequency ? ? ? Min 4X/week ? ? ? ?  ?PT Plan Current plan remains appropriate  ? ? ?Co-evaluation   ?  ?  ?  ?  ? ?  ?AM-PAC PT "6 Clicks" Mobility   ?Outcome Measure ? Help needed turning from your back to your side while in a flat bed without using bedrails?: None ?Help needed moving from lying on your back to sitting on the side of a flat bed without using bedrails?: None ?Help needed moving to and from a bed to a chair (including a wheelchair)?: A Little ?Help needed standing up from a chair using your arms (e.g., wheelchair or bedside chair)?: A Little ?Help needed to walk in hospital room?: A Little ?Help needed climbing 3-5 steps with a railing? : A Little ?6 Click Score: 20 ? ?  ?End of Session Equipment Utilized During Treatment: Gait belt ?Activity Tolerance: Patient tolerated treatment well ?Patient left: with call bell/phone within reach;in bed;with family/visitor present ?Nurse Communication: Mobility status (pt ready to d/c) ?PT Visit Diagnosis: Unsteadiness on feet (R26.81);Muscle weakness (generalized) (M62.81);Difficulty in walking, not elsewhere classified (R26.2) ?  ? ? ?Time: 6789-3810 ?PT Time Calculation (min) (ACUTE ONLY): 21 min ? ?Charges:  $Gait Training: 8-22 mins          ?          ? ?Anise Salvo, PT ?Acute Rehab Services ?Pager [phone] ?[facility] [phone] ? ? ? ?Billey Chang Yehia Mcbain ?07/30/2021, 4:37 PM ? ?

## 2021-07-31 LAB — QUANTIFERON-TB GOLD PLUS (RQFGPL)
QuantiFERON Mitogen Value: 0.64 IU/mL
QuantiFERON Nil Value: 0 IU/mL
QuantiFERON TB1 Ag Value: 0 IU/mL
QuantiFERON TB2 Ag Value: 0 IU/mL

## 2021-07-31 LAB — QUANTIFERON-TB GOLD PLUS: QuantiFERON-TB Gold Plus: NEGATIVE

## 2021-08-01 ENCOUNTER — Encounter: Payer: Self-pay | Admitting: Physical Therapy

## 2021-08-01 ENCOUNTER — Ambulatory Visit: Payer: BC Managed Care – PPO | Attending: Internal Medicine | Admitting: Physical Therapy

## 2021-08-01 DIAGNOSIS — M6281 Muscle weakness (generalized): Secondary | ICD-10-CM | POA: Diagnosis present

## 2021-08-01 DIAGNOSIS — G35 Multiple sclerosis: Secondary | ICD-10-CM | POA: Insufficient documentation

## 2021-08-01 DIAGNOSIS — R260 Ataxic gait: Secondary | ICD-10-CM | POA: Diagnosis present

## 2021-08-01 DIAGNOSIS — R270 Ataxia, unspecified: Secondary | ICD-10-CM | POA: Diagnosis not present

## 2021-08-01 DIAGNOSIS — R2689 Other abnormalities of gait and mobility: Secondary | ICD-10-CM | POA: Diagnosis present

## 2021-08-01 DIAGNOSIS — Z9181 History of falling: Secondary | ICD-10-CM | POA: Diagnosis present

## 2021-08-01 DIAGNOSIS — R2681 Unsteadiness on feet: Secondary | ICD-10-CM | POA: Diagnosis present

## 2021-08-01 DIAGNOSIS — G379 Demyelinating disease of central nervous system, unspecified: Secondary | ICD-10-CM | POA: Diagnosis not present

## 2021-08-01 NOTE — Therapy (Addendum)
?OUTPATIENT PHYSICAL THERAPY NEURO EVALUATION ? ? ?Patient Name: Elizabeth Tran ?MRN: [id number] ?DOB:[birth year], 21 y.o., female ?Today's Date: 08/01/2021 ? ?PCP: Roycroft, Barbette Hair, MD ?REFERRING PROVIDER: Shon Millet, MD ? ? ? ? 08/01/21 0936  ?PT Visits / Re-Eval  ?Visit Number 1  ?Number of Visits 4  ?Date for PT Re-Evaluation 08/22/21  ?Authorization  ?Authorization Type BCBS [id number]  ?PT Time Calculation  ?PT Start Time 0930  ?PT Stop Time 1015  ?PT Time Calculation (min) 45 min  ?PT - End of Session  ?Equipment Utilized During Treatment Gait belt  ?Activity Tolerance Patient tolerated treatment well  ?Behavior During Therapy [facility] for tasks assessed/performed  ? ? ? ?History reviewed. No pertinent past medical history. ?History reviewed. No pertinent surgical history. ?Patient Active Problem List  ? Diagnosis Date Noted  ? Depression 07/26/2021  ? Multiple sclerosis (HCC) 07/25/2021  ? Normocytic anemia 07/25/2021  ? Hypokalemia 07/25/2021  ? 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 ? ?ONSET DATE: 07/28/2021  ? ?REFERRING DIAG: G37.9 (ICD-10-CM) - Demyelinating disease (HCC) G35 (ICD-10-CM) - Multiple sclerosis (HCC)  ? ?THERAPY DIAG:  ?Ataxic gait ? ?History of falling ? ?Other abnormalities of gait and mobility ? ?Unsteadiness on feet ? ?Muscle weakness (generalized) ? ?SUBJECTIVE:  ?                                                                                                                                                                                           ? ?SUBJECTIVE STATEMENT: ?"The MS gets my balance off and makes walking difficult." ?Pt accompanied by: self and family member-Mother [name] ? ?PERTINENT HISTORY: major depressive disorder, Normocytic anemia, hypokalemia ? ?PAIN:  ?Are you having pain? No ? ?PRECAUTIONS: Fall ? ?WEIGHT BEARING RESTRICTIONS No ? ?FALLS: Has patient fallen  in last 6 months? Yes. Number of falls "I fell at the neurologist place" ? ?LIVING ENVIRONMENT: ?Lives with:  2 roommates, currently staying with boyfriend, will be moving back home for summer to live with mom, grandma, and brother -[place] continue [place] in [place] ?Lives in: House/apartment ?Stairs: Yes: External: 15 steps; on left going up ?Has following equipment at home: Single point cane, Crutches, and shower chair ? ?PLOF: Independent-never drives ? ?PATIENT GOALS "To be able to walk properly without the cane." ? ?OBJECTIVE:  ? ?DIAGNOSTIC FINDINGS: MRI from 07/25/2021: "1. Extensive patchy signal abnormality throughout the majority of ?the thoracic spinal cord, consistent with demyelinating disease. ?Probable subtle patchy enhancement about the lesions at the levels ?T7-8 and T8-9, consistent with active demyelination. ?2.  No significant degenerative disc disease within the thoracic ?spine. No stenosis or neural impingement." ? ?COGNITION: ?Overall cognitive status: Within functional limits for tasks assessed and pt feels like she needs increased time to process things ?  ?SENSATION: ?WFL ? ?COORDINATION: ?Heel-to-shin:  intact bilaterally ? ?EDEMA:  ?None noted bilaterally. ? ?MUSCLE TONE: RLE: Mild, Hypertonic into knee flexion, and Bilateral clonus lasting 2 seconds ? ? ?POSTURE: No Significant postural limitations ? ?LE ROM:    ? ?Active  Right ?08/01/2021 Left ?08/01/2021  ?Hip flexion [facility] WFL  ?Hip extension    ?Hip abduction [facility] WFL  ?Hip adduction [facility] WFL  ?Hip internal rotation    ?Hip external rotation    ?Knee flexion [facility] WFL  ?Knee extension [facility] WFL  ?Ankle dorsiflexion [facility] WFL  ?Ankle plantarflexion    ?Ankle inversion    ?Ankle eversion    ? (Blank rows = not tested) ? ?MMT:   ? ?MMT Right ?08/01/2021 Left ?08/01/2021  ?Hip flexion 4+/5 4/5  ?Hip extension    ?Hip abduction 4/5 4+/5  ?Hip adduction 4+/5 4+/5  ?Hip internal rotation    ?Hip external rotation    ?Knee flexion 4+/5 5/5  ?Knee extension  4/5 5/5  ?Ankle dorsiflexion 4+/5 5/5  ?Ankle plantarflexion    ?Ankle inversion    ?Ankle eversion    ?(Blank rows = not tested) ? ? ?TRANSFERS: ?Assistive device utilized: Single point cane  ?Sit to stand: SBA ?Stand to sit: SBA ? ?STAIRS: ? Level of Assistance: CGA ? Stair Negotiation Technique: Step to Pattern with Single Rail on Left ? Number of Stairs: 4  ? Height of Stairs: 6"  ?Comments: Pt negotiates stairs using step to with LLE leading.  Pt demos inc unsteadiness on descent.  She uses SPC in RUE. ? ?GAIT: ?Gait pattern: step to pattern, decreased arm swing- Right, decreased arm swing- Left, decreased step length- Right, decreased step length- Left, decreased stride length, trendelenburg, decreased trunk rotation, and narrow BOS ?Distance walked: 79' ?Assistive device utilized: Single point cane ?Level of assistance: SBA ?Comments: Pt ambulates with slow pace around gym.  She demonstrates a step to with cane in R hand and step to LLE with RLE.  Requires directions to hold cane in proper position prior to use. ? ?FUNCTIONAL TESTs:  ?5 times sit to stand: 17.33sec ?Timed up and go (TUG): 21.36 sec ? ?PATIENT SURVEYS:  ?FOTO N/A. ? ?TODAY'S TREATMENT:  ?Next session. ? ? ?PATIENT EDUCATION: ?Education details: PT POC, assessments used, goals set. ?Person educated: Patient and mother ?Education method: Explanation ?Education comprehension: verbalized understanding and needs further education ? ? ?HOME EXERCISE PROGRAM: ?To be established next session. ? ? ? ?GOALS: ?Goals reviewed with patient? Yes ? ?SHORT TERM GOALS= LTGs ? ?LONG TERM GOALS: Target date:  08/22/2021 ? ?Pt will be independent with strength and balance HEP with supervision from family. ?Baseline: To be established next visit ?Goal status: INITIAL ? ?2.  Pt will ambulate 400 feet with LRAD and supervision level of assist to promote household and community access. ? ?Baseline: 53' w/ SPC on R  ?Goal status: INITIAL ? ?3.  Pt will negotiate 4 6"  stairs using LRAD and no rails with reciprocal gait to promote safe mobility at home and in community. ?Baseline: 4 6" stairs step to w/ [facility] and rail ?Goal status: INITIAL ? ?4.  Pt will demonstrate TUG of <20 seconds in order to decrease risk of falls and improve functional mobility using LRAD. ?Baseline: 21.36sec ?Goal  status: INITIAL ? ?5.  Pt will decrease 5xSTS to <15 seconds in order to demonstrate decreased risk for falls and improved functional bilateral LE strength and power. ?Baseline: 17.33sec ?Goal status: INITIAL ? ? ?ASSESSMENT: ? ?CLINICAL IMPRESSION: ?Patient is a 21 y.o. female who was seen today for physical therapy evaluation and treatment for multiple sclerosis.  Pt has a significant PMH of major depressive disorder, Normocytic anemia, hypokalemia.  Identified impairments include gait deviations, history of a fall, spasticity, decreased activity tolerance, and reported changes in cognition.  Evaluation via the following assessment tools: 5xSTS and TUG indicate fall risk.  They would benefit from skilled PT to address impairments as noted and progress towards long term goals.  ? ? ?OBJECTIVE IMPAIRMENTS Abnormal gait, decreased activity tolerance, decreased balance, decreased cognition, decreased endurance, decreased knowledge of condition, decreased knowledge of use of DME, decreased mobility, difficulty walking, decreased strength, decreased safety awareness, and impaired tone.  ? ?ACTIVITY LIMITATIONS community activity, driving, and school.  ? ?PERSONAL FACTORS Age, Past/current experiences, Social background, Transportation, and 3+ comorbidities: major depressive disorder, Normocytic anemia, hypokalemia  are also affecting patient's functional outcome.  ? ? ?REHAB POTENTIAL: Good ? ?CLINICAL DECISION MAKING: Evolving/moderate complexity ? ?EVALUATION COMPLEXITY: Moderate ? ?PLAN: ?PT FREQUENCY: 1x/week ? ?PT DURATION: 3 weeks ? ?PLANNED INTERVENTIONS: Therapeutic exercises, Therapeutic  activity, Neuromuscular re-education, Balance training, Gait training, Patient/Family education, Joint mobilization, Stair training, Vestibular training, DME instructions, and Manual therapy ? ?PLAN FOR N

## 2021-08-04 NOTE — Addendum Note (Signed)
Addended by: Camille Bal B on: 08/04/2021 10:10 PM ? ? Modules accepted: Orders ? ?

## 2021-08-05 ENCOUNTER — Ambulatory Visit: Payer: BC Managed Care – PPO | Admitting: Physical Therapy

## 2021-08-05 DIAGNOSIS — R2689 Other abnormalities of gait and mobility: Secondary | ICD-10-CM

## 2021-08-05 DIAGNOSIS — R260 Ataxic gait: Secondary | ICD-10-CM | POA: Diagnosis not present

## 2021-08-05 DIAGNOSIS — R2681 Unsteadiness on feet: Secondary | ICD-10-CM

## 2021-08-05 DIAGNOSIS — Z9181 History of falling: Secondary | ICD-10-CM

## 2021-08-05 DIAGNOSIS — M6281 Muscle weakness (generalized): Secondary | ICD-10-CM

## 2021-08-05 NOTE — Therapy (Signed)
?OUTPATIENT PHYSICAL THERAPY TREATMENT NOTE ? ? ?Patient Name: Elizabeth Tran ?MRN: [id number] ?DOB:03/15/2001, 21 y.o., female ?Today's Date: 08/05/2021 ? ?PCP: Roycroft, Sandy Salaam, MD ?REFERRING PROVIDER: Pieter Partridge, DO  ? ?END OF SESSION:  ? PT End of Session - 08/05/21 0805   ? ? Visit Number 2   ? Number of Visits 4   ? Date for PT Re-Evaluation 08/22/21   ? Authorization Type BCBS 2023   ? PT Start Time [phone]   ? PT Stop Time [id number]   ? PT Time Calculation (min) 43 min   ? Equipment Utilized During Treatment Gait belt   ? Activity Tolerance Patient tolerated treatment well   ? Behavior During Therapy WFL for tasks assessed/performed;Flat affect   ? ?  ?  ? ?  ? ? ?No past medical history on file. ?No past surgical history on file. ?Patient Active Problem List  ? Diagnosis Date Noted  ? Depression 07/26/2021  ? Multiple sclerosis ([place]) 07/25/2021  ? Normocytic anemia 07/25/2021  ? Hypokalemia 07/25/2021  ? Demyelinating disease of central nervous system ([place]) 07/21/2021  ? MDD (major depressive disorder), recurrent severe, without psychosis ([place]) 12/30/2020  ? ? ?REFERRING DIAG: G37.9 (ICD-10-CM) - Demyelinating disease ([place]) G35 (ICD-10-CM) - Multiple sclerosis ([place])  ? ?THERAPY DIAG:  ?Other abnormalities of gait and mobility ? ?Unsteadiness on feet ? ?Muscle weakness (generalized) ? ?History of falling ? ?PERTINENT HISTORY: major depressive disorder, Normocytic anemia, hypokalemia  ? ?PRECAUTIONS: Fall ? ?SUBJECTIVE: No new changes to report. Reports fatigue as 0/10  ? ?PAIN:  ?Are you having pain? No ?OBJECTIVE:  ?  ?DIAGNOSTIC FINDINGS: MRI from 07/25/2021: "1. Extensive patchy signal abnormality throughout the majority of ?the thoracic spinal cord, consistent with demyelinating disease. ?Probable subtle patchy enhancement about the lesions at the levels ?T7-8 and T8-9, consistent with active demyelination. ?2. No significant degenerative disc disease within the thoracic ?spine. No stenosis or neural  impingement." ?  ?COGNITION: ?Overall cognitive status: Within functional limits for tasks assessed and pt feels like she needs increased time to process things ?       ?  ?TODAY'S TREATMENT:  ?Ther Ex  ?SciFit level 4 for 6 minutes w/BLE/BUEs for dynamic cardiovascular warmup, UE/LE coordination and BLE strength. Min cues for improved ROM, as pt exhibited hypometric movement throughout.  ? ?Eccentric alt. step downs from 6" step w/RUE support on rail and LUE support on SPC, x10 per side for improved quad and hip strength. Min cues to tap down w/heel for improved anterior tib recruitment  ? ?Ther Act  ?Established and demonstrated initial HEP (see below) with emphasis on single leg stability, BLE strength and improved transfers. Pt able to perform each exercise with min verbal cues for proper form.  ?  ?  ?PATIENT EDUCATION: ?Education details: Initial HEP, beginning walking program for improved global conditioning  ?Person educated: Patient and boyfriend  ?Education method: Explanation and handout  ?Education comprehension: verbalized understanding and needs further education ?  ?  ?HOME EXERCISE PROGRAM: ?Access Code: [email or web address] ?URL: [email or web address] ?Date: 08/05/2021 ?Prepared by: Mickie Bail Pearce [place] ? ?Exercises ?- Sit to Stand with Resistance Around Legs  - 1 x daily - 7 x weekly - 3 sets - 10 reps ?- Standing March with Counter Support  - 1 x daily - 7 x weekly - 3 sets - 10 reps ?- Toe Walking with Counter Support  - 1 x daily - 7 x weekly - 3 sets - 10 reps ?- Side Stepping with  Resistance at Thighs and Counter Support  - 1 x daily - 7 x weekly - 3 sets - 10 reps ?- Single Leg Bridge  - 1 x daily - 7 x weekly - 3 sets - 5 reps ?  ?  ?GOALS: ?Goals reviewed with patient? Yes ?  ?SHORT TERM GOALS= LTGs ?  ?LONG TERM GOALS: Target date:  08/22/2021 ?  ?Pt will be independent with strength and balance HEP with supervision from family. ?Baseline: To be established next visit ?Goal status: INITIAL ?   ?2.  Pt will ambulate 400 feet with LRAD and supervision level of assist to promote household and community access. ?  ?Baseline: 62' w/ SPC on R  ?Goal status: INITIAL ?  ?3.  Pt will negotiate 4 6" stairs using LRAD and no rails with reciprocal gait to promote safe mobility at home and in community. ?Baseline: 4 6" stairs step to w/ [facility] and rail ?Goal status: INITIAL ?  ?4.  Pt will demonstrate TUG of <20 seconds in order to decrease risk of falls and improve functional mobility using LRAD. ?Baseline: 21.36sec ?Goal status: INITIAL ?  ?5.  Pt will decrease 5xSTS to <15 seconds in order to demonstrate decreased risk for falls and improved functional bilateral LE strength and power. ?Baseline: 17.33sec ?Goal status: INITIAL ?  ?  ?ASSESSMENT: ?  ?CLINICAL IMPRESSION: ?Emphasis of skilled PT session on establishing HEP with emphasis on BLE strength, improved transfers and single leg stability. Pt able to perform each exercise well, with most difficulty performing single leg bridge. Pt fatigues very quickly and required min cues to maintain knee extension during sit <>stands. Pt encouraged to begin walking program for improved endurance. Continue POC.  ?  ?OBJECTIVE IMPAIRMENTS Abnormal gait, decreased activity tolerance, decreased balance, decreased cognition, decreased endurance, decreased knowledge of condition, decreased knowledge of use of DME, decreased mobility, difficulty walking, decreased strength, decreased safety awareness, and impaired tone.  ?  ?ACTIVITY LIMITATIONS community activity, driving, and school.  ?  ?PERSONAL FACTORS Age, Past/current experiences, Social background, Transportation, and 3+ comorbidities: major depressive disorder, Normocytic anemia, hypokalemia  are also affecting patient's functional outcome.  ?  ?  ?REHAB POTENTIAL: Good ?  ?CLINICAL DECISION MAKING: Evolving/moderate complexity ?  ?EVALUATION COMPLEXITY: Moderate ?  ?PLAN: ?PT FREQUENCY: 1x/week ?  ?PT DURATION: 3 weeks ?   ?PLANNED INTERVENTIONS: Therapeutic exercises, Therapeutic activity, Neuromuscular re-education, Balance training, Gait training, Patient/Family education, Joint mobilization, Stair training, Vestibular training, DME instructions, and Manual therapy ?  ?PLAN FOR NEXT SESSION: How is HEP? Continue single leg stability exercises. Will focus all sessions on gait and stair training w/ SPC and w/o as able and finalizing HEP for strength and balance, plan to d/c 08/22/2021 as pt is student who plans to pick up PT in [facility] beginning in May ? ? ? ?Cruzita Lederer Lance Galas, PT, DPT ?08/05/2021, 8:57 AM ? ?   ?

## 2021-08-05 NOTE — Progress Notes (Signed)
? ?NEUROLOGY FOLLOW UP OFFICE NOTE ? ?Elizabeth Tran ?[id number] ? ?Assessment/Plan:  ? ?Newly-diagnosed relapsing-remitting multiple sclerosis. ?  ?Plan to start Tysabri ?Continue D3 50,000 IU every 7 days ?Continue Physical therapy.  Ask that they provide [place] with a PT center at home to refer ?Repeat MRI of brain, cervical and thoracic spine with and without contrast in 6 months ?Repeat CBC with diff, CMP, JC virus antibody and index, and vit D in 6 months ?Follow up in 6 months after repeat testing. ?  ?  ?Subjective:  ?Elizabeth Tran is a 21 year old female with GAD, PTSD who follows up for multiple sclerosis.  MRI of neuro-axis performed in hospital personally reviewed.  She is accompanied by her boyfriend.  Her mom present via phone.   ? ?UPDATE: ?Seen for initial consultation on 07/25/2021.  She exhibited significant decline since referral was made just several days before.  Didn't feel well.  Extremely unsteady with falls.  Weakness in the legs.  Due to the significant worsening symptoms, she was advised to go to the hospital to be admitted for acute management of probable MS and completion of workup.  Repeat MRI of brain with and without contrast again demonstrated extensive supratentorial and infratentorial white matter lesions but now with multiple enhancing lesions involving the cerebral hemispheres and brainstem indicating active demyelination.  MRI of cervical and thoracic spine with and without contrast showed extensive demyelinating lesions throughout the cervical and thoracic spinal cord with an enhancing lesion at the C6-7 level and probable subtle patchy enhancing lesions at levels consistent with active demyelination.  Blood work included negative HIV, negative I-stat hCG, TSH 1.605, B12 544, folate 14.7, negative iron panel, negative JCV PCR, negative Quantiferon-TB Gold, negative acute Hep B panel and vit D level of 26.08.  She received 5 day course of IV Solu-Medrol.  She was evaluated by PT  who recommended outpatient physical therapy.  Overall, she is doing better.  She has started PT.  Balance is unsteady but improved.  Using a cane.  Denies visual disturbance and pain.  Her school semester ends in May and she is going home which is an hour a way.  She will be back in [place] for summer session in July.  Following hospitalization, she developed bumpy rash on forehead.  Not sure if reaction to the Solu-Medrol.   ?  ?HISTORY: ?In early March, she began feeling off balance.  At first it ws believed to be side effects of eating edibles.  She was seen in the ED where labs were unremarkable and she was treated with IVF.  She was then started on fluoxetine.  She then started noticing that she was leaning towards the right and felt dizzy and lightheaded when turning her head or walking.  She would have associated blurred vision with these spells.  She had an MRI of the brain with and without contrast on 07/17/2021 which showed severe white matter disease without abnormal enhancement, including the bilateral cerebral hemispheres, right side of brainstem and right middle cerebellar peduncle, concerning for demyelinating disease.  On 3/27, she started experiencing numbness in the legs up to the abdomen and feels a tightness in her abdomen.  Notes weakness articularly in the right leg.  No appetite.  She wants to sleep all of the time.  Balance has progressively gotten worse.   ?  ?07/17/2021 MRI BRAIN W WO:  No acute infarct, mass effect or extra-axial collection. No acute or chronic hemorrhage. There is severe bilateral  white matter disease with multiple hyperintense T2-weighted signal lesions within both hemispheres. There are lesions of the right brainstem and right middle cerebellar peduncle. The midline structures are normal. There is no abnormal contrast enhancement. ? ?PAST MEDICAL HISTORY: ?No past medical history on file. ? ?MEDICATIONS: ?Current Outpatient Medications on File Prior to Visit   ?Medication Sig Dispense Refill  ? ARIPiprazole (ABILIFY) 10 MG tablet Take 10 mg by mouth daily.    ? famotidine (PEPCID) 10 MG tablet Take 20 mg by mouth daily as needed for heartburn or indigestion. Pepcid Complete OTC    ? ferrous sulfate 300 (60 Fe) MG/5ML syrup Take 3.7 mLs (220 mg total) by mouth daily with breakfast. 150 mL 3  ? FLUoxetine (PROZAC) 10 MG capsule Take 10 mg by mouth at bedtime.    ? meclizine (ANTIVERT) 25 MG tablet Take 1 tablet (25 mg total) by mouth 3 (three) times daily as needed for dizziness. 30 tablet 0  ? Vitamin D, Ergocalciferol, (DRISDOL) 1.25 MG (50000 UNIT) CAPS capsule Take 1 capsule (50,000 Units total) by mouth every 7 (seven) days. 5 capsule 0  ? ?No current facility-administered medications on file prior to visit.  ? ? ?ALLERGIES: ?No Known Allergies ? ?FAMILY HISTORY: ?No family history on file. ? ?  ?Objective:  ?Blood pressure 116/80, pulse 65, height 5\' 6"  (1.676 m), weight 196 lb (88.9 kg), SpO2 100 %. ?General: No acute distress.  Patient appears well-groomed.   ?Head:  Normocephalic/atraumatic ?Eyes:  Fundi examined but not visualized ?Neck: supple, no paraspinal tenderness, full range of motion ?Heart:  Regular rate and rhythm ?Lungs:  Clear to auscultation bilaterally ?Back: No paraspinal tenderness ?Neurological Exam: alert and oriented to person, place, and time.  Speech fluent and not dysarthric, language intact.  CN II-XII intact. Bulk and tone normal, Motor:  muscle strength 4/5 right EHL otherwise 5/5 throughout  Sensation:  Pinprick, temperature sensation intact.  Vibratory sensation reduced in toes  Deep Tendon Reflexes:  3+ throughout,  toes downgoing.  Finger to nose testing:  mild kinetic tremor but without dysmetria.  Gait:  Broad-based, cautious but improved. Ambulatng with cane. Unable to tandem walk.  Timed 25 foot walk 5.98 seconds.  Romberg with mild sway. ? ? ? , DO ? ?CC: Shon Millet, MD ? ? ? ? ? ? ?

## 2021-08-06 ENCOUNTER — Encounter: Payer: Self-pay | Admitting: Neurology

## 2021-08-06 ENCOUNTER — Ambulatory Visit: Payer: BC Managed Care – PPO | Admitting: Neurology

## 2021-08-06 ENCOUNTER — Telehealth: Payer: Self-pay | Admitting: Pharmacy Technician

## 2021-08-06 VITALS — BP 116/80 | HR 65 | Ht 66.0 in | Wt 196.0 lb

## 2021-08-06 DIAGNOSIS — G35 Multiple sclerosis: Secondary | ICD-10-CM

## 2021-08-06 NOTE — Progress Notes (Signed)
Explained the Tysabri infusion process to patient in detail. Mother, Friend and patient aware of where [facility] is located the PA process and they will reach out to them to schedule the appointment. ?Insurance will change please call to give new insurance information. ?Tysabri MS touch paperwork filled out. ? ?

## 2021-08-06 NOTE — Telephone Encounter (Addendum)
Auth Submission: PENDING ?Payer: BCBS ?Medication & CPT/J Code(s) submitted: Tysabri (Natalizumab) G5364 ?Route of submission (phone, fax, portal): PHONE: [phone] ?FAX: [phone] ?Auth type: Buy/Bill ?Units/visits requested: 300MG  Q28DAYS ?Reference number: KEY; ?Approval from:  to  at [facility]. ? ?Will need to re-verify insurance 08/25/21 and re-submit new PA. ? ? ?Will update once we receive a response. ? ?  ?

## 2021-08-06 NOTE — Patient Instructions (Addendum)
Start Tysabri every 4 weeks ?Take D3 50,000 IU every 7 weeks ?Continue physical therapy ?Repeat MRI of brain, cervical and thoracic spine with and without contrast in 6 months ?Repeat CBC with diff, CMP, JC virus antibody and index, and vit D in 6 months ?Follow up in 6 months after repeat testing. ? ? ? ?Multiple Sclerosis ?Multiple sclerosis (MS) is a disease of the brain, spinal cord, and optic nerves (central nervous system). It causes the body's disease-fighting (immune) system to destroy the protective covering (myelin sheath) around nerves in the brain. When this happens, signals (nerve impulses) going to and from the brain and spinal cord do not get sent properly or may not get sent at all. ?There are several types of MS: ?Relapsing-remitting MS. This is the most common type. This causes sudden attacks of symptoms. After an attack, you may recover completely until the next attack, or some symptoms may remain permanently. ?Secondary progressive MS. This usually develops after the onset of relapsing-remitting MS. Similar to relapsing-remitting MS, this type also causes sudden attacks of symptoms. Attacks may be less frequent, but symptoms slowly get worse (progress) over time. ?Primary progressive MS. This causes symptoms that steadily progress over time. This type of MS does not cause sudden attacks of symptoms. ?The age of onset of MS varies, but it often develops between 80-15 years of age. MS is a lifelong (chronic) condition. There is no cure, but treatment can help slow down the progression of the disease. ?What are the causes? ?The cause of this condition is not known. ?What increases the risk? ?You are more likely to develop this condition if: ?You are a woman. ?You have a relative with MS. However, the condition is not passed from parent to child (inherited). ?You have a lack (deficiency) of vitamin D. ?You smoke. ?MS is more common in the [place] than in the [place]. ?What are the signs or symptoms? ?Relapsing-remitting and secondary progressive MS cause symptoms to occur in episodes or attacks that may last weeks to months. There may be long periods between attacks in which there are almost no symptoms. Primary progressive MS causes symptoms to steadily progress after they develop. ?Symptoms of MS vary because of the many different ways it affects the central nervous system. The main symptoms include: ?Vision problems and eye pain. ?Numbness and weakness. ?Inability to move your arms, hands, feet, or legs (paralysis). ?Balance problems. ?Shaking that you cannot control (tremors). ?Muscle spasms. ?Problems with thinking (cognitive changes). ?MS can also cause symptoms that are associated with the disease, but are not always the direct result of an MS attack. They may include: ?Inability to control urination or bowel movements (incontinence). ?Headaches. ?Fatigue. ?Inability to tolerate heat. ?Emotional changes. ?Depression. ?Pain. ?How is this diagnosed? ?This condition is diagnosed based on: ?Your symptoms. ?A neurological exam. This involves checking central nervous system function, such as nerve function, reflexes, and coordination. ?MRIs of the brain and spinal cord. ?Lab tests, including a lumbar puncture that tests the fluid that surrounds the brain and spinal cord (cerebrospinal fluid). ?Tests to measure the electrical activity of the brain in response to stimulation (evoked potentials). ?How is this treated? ?There is no cure for MS, but medicines can help decrease the number and frequency of attacks and help relieve nuisance symptoms. Treatment options may include: ?Medicines that reduce the frequency of attacks. These medicines may be given by injection, by mouth (orally), or through an IV. ?Medicines that reduce inflammation (steroids). These  may provide short-term relief of symptoms. ?Medicines to help control pain, depression, fatigue, or  incontinence. ?Nutritional counseling. Vitamin D supplements, if you have a deficiency. ?Using devices to help you move around (assistive devices), such as braces, a cane, or a walker. ?Physical therapy to strengthen and stretch your muscles. ?Occupational therapy to help you with everyday tasks. ?Alternative or complementary treatments such as exercise, massage, or acupuncture. ?Follow these instructions at home: ?Take over-the-counter and prescription medicines only as told by your health care provider. ?Do not drive or use heavy machinery while taking prescription pain medicine. ?Use assistive devices as recommended by your physical therapist or your health care provider. ?Exercise as directed by your health care provider. ?Eating healthy can help manage MS symptoms. ?Return to your normal activities as told by your health care provider. Ask your health care provider what activities are safe for you. ?Reach out for support. Share your feelings with friends, family, or a support group. ?Keep all follow-up visits as told by your health care provider and therapists. This is important. ?Where to find more information ?National Multiple Sclerosis Society: https://www.nationalmssociety.org ?[employer] of Neurological Disorders and Stroke: https://johnson-smith.net/ ?[facility] for Complementary and Integrative Health: http://miller-hamilton.net/ ?Contact a health care provider if: ?You feel depressed. ?You develop new pain or numbness. ?You have tremors. ?You have problems with sexual function. ?Get help right away if: ?You develop paralysis. ?You develop numbness. ?You have problems with your bladder or bowel function. ?You develop double vision. ?You lose vision in one or both eyes. ?You develop suicidal thoughts. ?You develop severe confusion. ?If you ever feel like you may hurt yourself or others, or have thoughts about taking your own life, get help right away. You can go to your nearest emergency  department or call: ?Your local emergency services (911 in the [place] ?A suicide crisis helpline, such as the National Suicide Prevention Lifeline at (317) 883-9729. This is open 24 hours a day. ?Summary ?Multiple sclerosis (MS) is a disease of the central nervous system that causes the body's immune system to destroy the protective covering (myelin sheath) around nerves in the brain. ?There are 3 types of MS: relapsing-remitting, secondary progressive, and primary progressive. Relapsing-remitting and secondary progressive MS cause symptoms to occur in episodes or attacks that may last weeks to months. Primary progressive MS causes symptoms to steadily progress after they develop. ?There is no cure for MS, but medicines can help decrease the number and frequency of attacks and help relieve nuisance symptoms. Treatment may also include physical or occupational therapy. ?If you develop numbness, paralysis, vision problems, or other neurological symptoms, get help right away. ?This information is not intended to replace advice given to you by your health care provider. Make sure you discuss any questions you have with your health care provider. ?Document Revised: 01/23/2020 Document Reviewed: 01/23/2020 ?Elsevier Patient Education ? 2022 Elsevier Inc. ? ?

## 2021-08-13 ENCOUNTER — Encounter: Payer: Self-pay | Admitting: Physical Therapy

## 2021-08-13 ENCOUNTER — Ambulatory Visit: Payer: BC Managed Care – PPO | Admitting: Physical Therapy

## 2021-08-13 ENCOUNTER — Encounter: Payer: Self-pay | Admitting: Neurology

## 2021-08-13 DIAGNOSIS — R260 Ataxic gait: Secondary | ICD-10-CM

## 2021-08-13 DIAGNOSIS — R2681 Unsteadiness on feet: Secondary | ICD-10-CM

## 2021-08-13 DIAGNOSIS — Z9181 History of falling: Secondary | ICD-10-CM

## 2021-08-13 DIAGNOSIS — R2689 Other abnormalities of gait and mobility: Secondary | ICD-10-CM

## 2021-08-13 DIAGNOSIS — M6281 Muscle weakness (generalized): Secondary | ICD-10-CM

## 2021-08-13 NOTE — Patient Instructions (Signed)
Access Code: [email or web address] ?URL: [email or web address] ?Date: 08/13/2021 ?Prepared by: Elease Etienne ? ?Exercises ?- Sit to Stand with Resistance Around Legs  - 1 x daily - 7 x weekly - 3 sets - 10 reps ?- Standing March with Counter Support  - 1 x daily - 7 x weekly - 3 sets - 10 reps ?- Toe Walking with Counter Support  - 1 x daily - 7 x weekly - 3 sets - 10 reps ?- Side Stepping with Resistance at Thighs and Counter Support  - 1 x daily - 7 x weekly - 3 sets - 10 reps ?- Single Leg Bridge  - 1 x daily - 7 x weekly - 3 sets - 5 reps ?- Tandem Walking with Counter Support  - 1 x daily - 7 x weekly - 3 sets - 10 reps ?- Heel Raises with Counter Support  - 1 x daily - 7 x weekly - 3 sets - 10 reps ?- Backward Walking with Counter Support  - 1 x daily - 7 x weekly - 3 sets - 10 reps ?

## 2021-08-13 NOTE — Therapy (Signed)
?OUTPATIENT PHYSICAL THERAPY TREATMENT NOTE ? ? ?Patient Name: Elizabeth Tran ?MRN: [id number] ?DOB:Jan 18, 2001, 21 y.o., female ?Today's Date: 08/13/2021 ? ?PCP: Roycroft, Barbette Hair, MD ?REFERRING PROVIDER: Drema Dallas, DO  ? ?END OF SESSION:  ? PT End of Session - 08/13/21 0848   ? ? Visit Number 3   ? Number of Visits 4   ? Date for PT Re-Evaluation 08/22/21   ? Authorization Type BCBS 2023   ? PT Start Time 0845   ? PT Stop Time 0926   ? PT Time Calculation (min) 41 min   ? Equipment Utilized During Treatment Gait belt   ? Activity Tolerance Patient tolerated treatment well   ? Behavior During Therapy WFL for tasks assessed/performed;Flat affect   ? ?  ?  ? ?  ? ? ?History reviewed. No pertinent past medical history. ?History reviewed. No pertinent surgical history. ?Patient Active Problem List  ? Diagnosis Date Noted  ? Depression 07/26/2021  ? Multiple sclerosis (HCC) 07/25/2021  ? Normocytic anemia 07/25/2021  ? Hypokalemia 07/25/2021  ? 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 ? ?REFERRING DIAG: G37.9 (ICD-10-CM) - Demyelinating disease (HCC) G35 (ICD-10-CM) - Multiple sclerosis (HCC)  ? ?THERAPY DIAG:  ?Other abnormalities of gait and mobility ? ?Unsteadiness on feet ? ?Muscle weakness (generalized) ? ?History of falling ? ?Ataxic gait ? ?PERTINENT HISTORY: major depressive disorder, Normocytic anemia, hypokalemia  ? ?PRECAUTIONS: Fall ? ?SUBJECTIVE: No falls or other changes. Reports fatigue as 0/10.  Pt is concerned about amount of HEP, but is okay with adding more.  She has done HEP twice since last visit. ? ?PAIN:  ?Are you having pain? No ?OBJECTIVE:  ?  ?DIAGNOSTIC FINDINGS: MRI from 07/25/2021: "1. Extensive patchy signal abnormality throughout the majority of ?the thoracic spinal cord, consistent with demyelinating disease. ?Probable subtle patchy enhancement about the lesions at the  levels ?T7-8 and T8-9, consistent with active demyelination. ?2. No significant degenerative disc disease within the thoracic ?spine. No stenosis or neural impingement." ?  ?COGNITION: ?Overall cognitive status: Within functional limits for tasks assessed and pt feels like she needs increased time to process things ?       ?  ?TODAY'S TREATMENT:  ?Ther Ex  ?SciFit level 3 x 3 minutes followed by L4x52mins then L2 x24mins w/ BLE only for dynamic cardiovascular warmup and reciprocal movement. Pt cued at onset of movement to engage entire ROM due to tendency for hypometric movement.  Total steps 540. ? ?Made additions to HEP.  See below for details. ? ?Gait Training ?Pt completes 4 laps, 460', in gym on level surface; she demos decreased L arm swing holding SPC in RUE and bilateral dec foot clearance.  She ambulates with dec speed.   ?Continued additions to HEP.  See below for details. ?Pt ambulates variable distances to and from stairs and mat table w/o SPC requiring CGA as pt demos dec size and shuffling steps.  Cued for inc step size to inc stability with pt able to maintain correction. ? ? ?  ?PATIENT EDUCATION: ?Education details: Additions to HEP, d/c plan for next visit, and doing some HEP everyday, not every exercise everyday. ?Person educated: Patient and boyfriend  ?Education method: Explanation and handout  ?Education comprehension: verbalized understanding and needs further education ?  ?  ?HOME EXERCISE PROGRAM: ?Access Code: [email or web address] ?URL: [email or web address] ?Date: 08/05/2021 ?Prepared by: Alethia Berthold Plaster ? ?Exercises ?- Sit to  Stand with Resistance Around Legs  - 1 x daily - 7 x weekly - 3 sets - 10 reps ?- Standing March with Counter Support  - 1 x daily - 7 x weekly - 3 sets - 10 reps ?- Toe Walking with Counter Support  - 1 x daily - 7 x weekly - 3 sets - 10 reps ?- Side Stepping with Resistance at Thighs and Counter Support  - 1 x daily - 7 x weekly - 3 sets - 10 reps ?- Single Leg  Bridge  - 1 x daily - 7 x weekly - 3 sets - 5 reps ?  - Tandem Walking with Counter Support  - 1 x daily - 7 x weekly - 3 sets - 10 reps - pt requires inc CGA-minA for R lateral LOB x2 ?- Heel Raises with Counter Support  - 1 x daily - 7 x weekly - 3 sets - 10 reps - performed with eccentric heel drop off on stairs w/ BUE on rails, but modified for home setup at counter ?- Backward Walking with Counter Support  - 1 x daily - 7 x weekly - 3 sets - 10 reps-cued to clear toe past heel ?  ?GOALS: ?Goals reviewed with patient? Yes ?  ?SHORT TERM GOALS= LTGs ?  ?LONG TERM GOALS: Target date:  08/22/2021 ?  ?Pt will be independent with strength and balance HEP with supervision from family. ?Baseline: To be established next visit ?Goal status: INITIAL ?  ?2.  Pt will ambulate 400 feet with LRAD and supervision level of assist to promote household and community access. ?  ?Baseline: 47' w/ SPC on R  ?Goal status: INITIAL ?  ?3.  Pt will negotiate 4 6" stairs using LRAD and no rails with reciprocal gait to promote safe mobility at home and in community. ?Baseline: 4 6" stairs step to w/ [facility] and rail ?Goal status: INITIAL ?  ?4.  Pt will demonstrate TUG of <20 seconds in order to decrease risk of falls and improve functional mobility using LRAD. ?Baseline: 21.36sec ?Goal status: INITIAL ?  ?5.  Pt will decrease 5xSTS to <15 seconds in order to demonstrate decreased risk for falls and improved functional bilateral LE strength and power. ?Baseline: 17.33sec ?Goal status: INITIAL ?  ?  ?ASSESSMENT: ?  ?CLINICAL IMPRESSION: ?Focus of skilled session on advancing HEP for balance and LE strengthening.  Pt remains limited by dec activity tolerance, hypometric movements, mildly dec motivation to tasks, and dec knowledge of condition.  She would benefit from skilled PT to further address deficits in gait, strength and dynamic balance to promote disease management and return to functional baseline as able.  Continue per POC w/ d/c next  visit on 4/28 so pt can pick up therapy in [facility] once home for summer. ?  ?OBJECTIVE IMPAIRMENTS Abnormal gait, decreased activity tolerance, decreased balance, decreased cognition, decreased endurance, decreased knowledge of condition, decreased knowledge of use of DME, decreased mobility, difficulty walking, decreased strength, decreased safety awareness, and impaired tone.  ?  ?ACTIVITY LIMITATIONS community activity, driving, and school.  ?  ?PERSONAL FACTORS Age, Past/current experiences, Social background, Transportation, and 3+ comorbidities: major depressive disorder, Normocytic anemia, hypokalemia  are also affecting patient's functional outcome.  ?  ?  ?REHAB POTENTIAL: Good ?  ?CLINICAL DECISION MAKING: Evolving/moderate complexity ?  ?EVALUATION COMPLEXITY: Moderate ?  ?PLAN: ?PT FREQUENCY: 1x/week ?  ?PT DURATION: 3 weeks ?  ?PLANNED INTERVENTIONS: Therapeutic exercises, Therapeutic activity, Neuromuscular re-education, Balance training,  Gait training, Patient/Family education, Joint mobilization, Stair training, Vestibular training, DME instructions, and Manual therapy ?  ?PLAN FOR NEXT SESSION: D/C 4/28-reassess LTGs!  How is HEP? Continue single leg stability exercises. Will focus all sessions on gait and stair training w/ SPC and w/o as able and finalizing HEP for strength and balance, plan to d/c 08/22/2021 as pt is student who plans to pick up PT in [facility] beginning in May ? ? ? ?Bary Richard, PT, DPT ?08/13/2021, 9:37 AM ? ?   ?

## 2021-08-14 ENCOUNTER — Encounter: Payer: Self-pay | Admitting: Neurology

## 2021-08-14 NOTE — Telephone Encounter (Addendum)
Dr. Everlena Cooper, ?Fyi note: ? ?Auth Submission: approved ?Payer: bcbs ?Medication & CPT/J Code(s) submitted: Tysabri (Natalizumab) W5809 ?Route of submission (phone, fax, portal): phone ?fax ?Auth type: Buy/Bill ?Units/visits requested: 300mg  q28d ?Reference number: ?Approval from: 08/11/21 to 08/10/22  ? ?Patient will be scheduled as soon as possible. ? ?  ?

## 2021-08-15 ENCOUNTER — Telehealth: Payer: Self-pay | Admitting: Pharmacy Technician

## 2021-08-15 NOTE — Telephone Encounter (Signed)
Please verify insurance prior to next appt. ?Tysabri - approved 08/11/21 - 08/10/22 ?PA: [id number] ?Upcoming appt: 08/21/21 ? ?Per chart notes: patient will have change in insurance. ? ?

## 2021-08-19 ENCOUNTER — Other Ambulatory Visit: Payer: Self-pay

## 2021-08-19 DIAGNOSIS — G35 Multiple sclerosis: Secondary | ICD-10-CM

## 2021-08-19 NOTE — Progress Notes (Signed)
Referral for a closer PT for patient added per patient and her mother. ?

## 2021-08-21 ENCOUNTER — Ambulatory Visit (INDEPENDENT_AMBULATORY_CARE_PROVIDER_SITE_OTHER): Payer: BC Managed Care – PPO

## 2021-08-21 ENCOUNTER — Encounter: Payer: Self-pay | Admitting: Neurology

## 2021-08-21 VITALS — BP 114/75 | HR 68 | Temp 98.2°F | Resp 18 | Ht 66.0 in | Wt 195.2 lb

## 2021-08-21 DIAGNOSIS — G35 Multiple sclerosis: Secondary | ICD-10-CM

## 2021-08-21 MED ORDER — LORATADINE 10 MG PO TABS
10.0000 mg | ORAL_TABLET | Freq: Once | ORAL | Status: AC
  Administered 2021-08-21: 10 mg via ORAL
  Filled 2021-08-21: qty 1

## 2021-08-21 MED ORDER — SODIUM CHLORIDE 0.9 % IV SOLN: INTRAVENOUS | Status: DC

## 2021-08-21 MED ORDER — ACETAMINOPHEN 325 MG PO TABS
650.0000 mg | ORAL_TABLET | Freq: Once | ORAL | Status: AC
  Administered 2021-08-21: 650 mg via ORAL
  Filled 2021-08-21: qty 2

## 2021-08-21 MED ORDER — SODIUM CHLORIDE 0.9 % IV SOLN
300.0000 mg | Freq: Once | INTRAVENOUS | Status: AC
  Administered 2021-08-21: 300 mg via INTRAVENOUS
  Filled 2021-08-21: qty 15

## 2021-08-21 NOTE — Progress Notes (Signed)
Diagnosis: Multiple Sclerosis ? ?Provider:  Chilton Greathouse, MD ? ?Procedure: Infusion ? ?IV Type: Peripheral, IV Location: L Antecubital ? ?Tysabri (Natalizumab), Dose: 300 mg ? ?Infusion Start Time: [phone] ? ?Infusion Stop Time: 1025 ? ?Post Infusion IV Care: Observation period completed and Peripheral IV Discontinued ? ?Discharge: Condition: Good, Destination: Home . AVS provided to patient.  ? ?Performed by:  Adriana Mccallum, RN  ?  ?

## 2021-08-21 NOTE — Telephone Encounter (Signed)
Spoke with patient in regards to insurance.  Patient states she does not anticipate any changes to insurance till August. Informed patient if there are any changes to inform [facility] of updated insurance.

## 2021-08-22 ENCOUNTER — Ambulatory Visit: Payer: BC Managed Care – PPO | Admitting: Physical Therapy

## 2021-08-22 ENCOUNTER — Encounter: Payer: Self-pay | Admitting: Physical Therapy

## 2021-08-22 DIAGNOSIS — R2681 Unsteadiness on feet: Secondary | ICD-10-CM

## 2021-08-22 DIAGNOSIS — Z9181 History of falling: Secondary | ICD-10-CM

## 2021-08-22 DIAGNOSIS — R260 Ataxic gait: Secondary | ICD-10-CM

## 2021-08-22 DIAGNOSIS — M6281 Muscle weakness (generalized): Secondary | ICD-10-CM

## 2021-08-22 DIAGNOSIS — R2689 Other abnormalities of gait and mobility: Secondary | ICD-10-CM

## 2021-08-22 NOTE — Therapy (Signed)
?OUTPATIENT PHYSICAL THERAPY TREATMENT NOTE ? ? ?Patient Name: Elizabeth Tran ?MRN: [id number] ?DOB:31-Aug-2000, 21 y.o., female ?Today's Date: 08/22/2021 ? ?PCP: Roycroft, Sandy Salaam, MD ?REFERRING PROVIDER: Pieter Partridge, DO  ?PHYSICAL THERAPY DISCHARGE SUMMARY ? ?Visits from Start of Care: 4 ? ?Current functional level related to goals / functional outcomes: ?See clinical assessment. ?  ?Remaining deficits: ?Hypometric movements, decreased gait speed, generalized LE weakness, decreased activity tolerance, stair negotiation and decreased knowledge of condition ?  ?Education / Equipment: ?Edu on D/C plan and continuum of care at following PT.  ? ?Patient agrees to discharge. Patient goals were met. Patient is being discharged due to meeting the stated rehab goals. ? ?END OF SESSION:  ? PT End of Session - 08/22/21 0935   ? ? Visit Number 4   ? Number of Visits 4   ? Date for PT Re-Evaluation 08/22/21   ? Authorization Type BCBS 2023   ? PT Start Time 0930   ? PT Stop Time 1000   ? PT Time Calculation (min) 30 min   ? Equipment Utilized During Treatment Gait belt   ? Activity Tolerance Patient tolerated treatment well   ? Behavior During Therapy WFL for tasks assessed/performed;Flat affect   ? ?  ?  ? ?  ? ? ?History reviewed. No pertinent past medical history. ?History reviewed. No pertinent surgical history. ?Patient Active Problem List  ? Diagnosis Date Noted  ? Depression 07/26/2021  ? Multiple sclerosis ([place]) 07/25/2021  ? Normocytic anemia 07/25/2021  ? Hypokalemia 07/25/2021  ? 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 ? ?REFERRING DIAG: G37.9 (ICD-10-CM) - Demyelinating disease ([place]) G35 (ICD-10-CM) - Multiple sclerosis ([place])  ? ?THERAPY DIAG:  ?Other abnormalities of gait and mobility ? ?Unsteadiness on feet ? ?Muscle weakness (generalized) ? ?History of falling ? ?Ataxic gait ? ?PERTINENT HISTORY:  major depressive disorder, Normocytic anemia, hypokalemia  ? ?PRECAUTIONS: Fall ? ?SUBJECTIVE: No falls or other changes. Reports fatigue as 0/10.  Pt is concerned about amount of HEP, but is okay with adding more.  She has done HEP twice since last visit. ? ?PAIN:  ?Are you having pain? No ?OBJECTIVE:  ?  ?DIAGNOSTIC FINDINGS: MRI from 07/25/2021: "1. Extensive patchy signal abnormality throughout the majority of ?the thoracic spinal cord, consistent with demyelinating disease. ?Probable subtle patchy enhancement about the lesions at the levels ?T7-8 and T8-9, consistent with active demyelination. ?2. No significant degenerative disc disease within the thoracic ?spine. No stenosis or neural impingement." ?  ?COGNITION: ?Overall cognitive status: Within functional limits for tasks assessed and pt feels like she needs increased time to process things ?       ?  ?TODAY'S TREATMENT:  ?Assessed LTGs.  See goal section for details. ?Verbally reviewed HEP.  See HEP section for details. ?  Established walking program. ? ? ?  ?PATIENT EDUCATION: ?Education details: Continuing HEP and walking program, D/C plan and continuum of care at following PT. ?Person educated: Patient and boyfriend  ?Education method: Explanation and handout  ?Education comprehension: verbalized understanding and needs further education ?  ?  ?HOME EXERCISE PROGRAM: ?Access Code: [email or web address] ?URL: [email or web address] ?Date: 08/05/2021 ?Prepared by: Mickie Bail Plaster ? ?Exercises ?- Sit to Stand with Resistance Around Legs  - 1 x daily - 7 x weekly - 3 sets - 10 reps ?- Standing March with Counter Support  - 1 x daily - 7 x weekly -  3 sets - 10 reps ?- Toe Walking with Counter Support  - 1 x daily - 7 x weekly - 3 sets - 10 reps ?- Side Stepping with Resistance at Thighs and Counter Support  - 1 x daily - 7 x weekly - 3 sets - 10 reps (Has not done) ?- Single Leg Bridge  - 1 x daily - 7 x weekly - 3 sets - 5 reps (Pt states she has tried, but  cannot do these) ?  - Tandem Walking with Counter Support  - 1 x daily - 7 x weekly - 3 sets - 10 reps - pt requires inc CGA-minA for R lateral LOB x2 ?- Heel Raises with Counter Support  - 1 x daily - 7 x weekly - 3 sets - 10 reps - performed with eccentric heel drop off on stairs w/ BUE on rails, but modified for home setup at counter ?- Backward Walking with Counter Support  - 1 x daily - 7 x weekly - 3 sets - 10 reps-cued to clear toe past heel (Has not done) ? Discussed walking program w/ pt:  4 day/wk for 30 mins at a time with a family member/boyfriend ?GOALS: ?Goals reviewed with patient? Yes ?  ?SHORT TERM GOALS= LTGs ?  ?LONG TERM GOALS: Target date:  08/22/2021 ?  ?Pt will be independent with strength and balance HEP with supervision from family. ?Baseline: Established on visit 2, pt performing portions of HEP regularly, inc difficulty with other portions. ?Goal status: MET ?  ?2.  Pt will ambulate 400 feet with LRAD and supervision level of assist to promote household and community access. ?Baseline: 115' w/ SPC on R; 08/22/2021 465' R SPC Mod I (intermittent cues to relax LUE) ?Goal status: MET ?  ?3.  Pt will negotiate 4 6" stairs using LRAD and no rails with reciprocal gait to promote safe mobility at home and in community. ?Baseline: 4 6" stairs step to w/ [facility] and rail; 08/22/2021 4 6" steps using R SPC and step to pattern ?Goal status: PARTIALLY MET ?  ?4.  Pt will demonstrate TUG of <20 seconds in order to decrease risk of falls and improve functional mobility using LRAD. ?Baseline: 21.36sec; 08/22/2021 11.10 sec ?Goal status: MET ?  ?5.  Pt will decrease 5xSTS to <15 seconds in order to demonstrate decreased risk for falls and improved functional bilateral LE strength and power. ?Baseline: 17.33sec; 08/22/2021 12.42 sec ?Goal status: MET ?  ?  ?ASSESSMENT: ?  ?CLINICAL IMPRESSION: ?Reassessed LTGs today with pt meeting 4 of 5 goals and partially meeting the last goal.  Her fall risk is decreased with a  10 second improvement in her TUG from 21.36 sec to 11.10 sec.  She demonstrates improved LE strength with a 5xSTS completed in 12.42sec.  She ambulates 63' w/ R SPC and is able to negotiate 4 stairs w/ use of just her cane.  She continues to require cuing to engage to task with increased speed and appropriate metric of movement and would benefit from further education on diagnosis and parameters for exercise. HEP was established and updated to carry pt over until new episode of care at PT closer to home.  Referral for this episode is already established and pt is in agreement to discharge today.   ?  ?OBJECTIVE IMPAIRMENTS Abnormal gait, decreased activity tolerance, decreased balance, decreased cognition, decreased endurance, decreased knowledge of condition, decreased knowledge of use of DME, decreased mobility, difficulty walking, decreased strength, decreased safety awareness,  and impaired tone.  ?  ?ACTIVITY LIMITATIONS community activity, driving, and school.  ?  ?PERSONAL FACTORS Age, Past/current experiences, Social background, Transportation, and 3+ comorbidities: major depressive disorder, Normocytic anemia, hypokalemia  are also affecting patient's functional outcome.  ?  ?  ?REHAB POTENTIAL: Good ?  ?CLINICAL DECISION MAKING: Evolving/moderate complexity ?  ?EVALUATION COMPLEXITY: Moderate ?  ?PLAN: ?PT FREQUENCY: 1x/week ?  ?PT DURATION: 3 weeks ?  ?PLANNED INTERVENTIONS: Therapeutic exercises, Therapeutic activity, Neuromuscular re-education, Balance training, Gait training, Patient/Family education, Joint mobilization, Stair training, Vestibular training, DME instructions, and Manual therapy ?  ?PLAN FOR NEXT SESSION: N/A ? ? ? ?Bary Richard, PT, DPT ?08/22/2021, 10:16 AM ? ?   ?

## 2021-08-25 ENCOUNTER — Other Ambulatory Visit: Payer: Self-pay | Admitting: Neurology

## 2021-08-25 MED ORDER — VITAMIN D (ERGOCALCIFEROL) 1.25 MG (50000 UNIT) PO CAPS: 50000.0000 [IU] | ORAL_CAPSULE | ORAL | 3 refills | Status: DC

## 2021-09-18 ENCOUNTER — Ambulatory Visit (INDEPENDENT_AMBULATORY_CARE_PROVIDER_SITE_OTHER): Payer: BC Managed Care – PPO

## 2021-09-18 VITALS — BP 103/71 | HR 68 | Temp 97.4°F | Resp 16 | Ht 66.0 in | Wt 189.8 lb

## 2021-09-18 DIAGNOSIS — G35 Multiple sclerosis: Secondary | ICD-10-CM

## 2021-09-18 MED ORDER — SODIUM CHLORIDE 0.9 % IV SOLN
300.0000 mg | Freq: Once | INTRAVENOUS | Status: AC
  Administered 2021-09-18: 300 mg via INTRAVENOUS
  Filled 2021-09-18: qty 15

## 2021-09-18 MED ORDER — ACETAMINOPHEN 325 MG PO TABS
650.0000 mg | ORAL_TABLET | Freq: Once | ORAL | Status: AC
  Administered 2021-09-18: 650 mg via ORAL
  Filled 2021-09-18: qty 2

## 2021-09-18 MED ORDER — LORATADINE 10 MG PO TABS
10.0000 mg | ORAL_TABLET | Freq: Once | ORAL | Status: AC
  Administered 2021-09-18: 10 mg via ORAL
  Filled 2021-09-18: qty 1

## 2021-09-18 MED ORDER — SODIUM CHLORIDE 0.9 % IV SOLN: INTRAVENOUS | Status: DC

## 2021-09-18 NOTE — Progress Notes (Signed)
Diagnosis: Multiple Sclerosis  Provider:  Chilton Greathouse, MD  Procedure: Infusion  IV Type: Peripheral, IV Location: R Forearm  Tysabri (Natalizumab), Dose: 300 mg  Infusion Start Time: 1350  Infusion Stop Time: 1445  Post Infusion IV Care: Observation period completed and Peripheral IV Discontinued  Discharge: Condition: Good, Destination: Home . AVS provided to patient.   Performed by:  Marilynn Rail, RN

## 2021-10-01 ENCOUNTER — Encounter: Payer: Self-pay | Admitting: Neurology

## 2021-10-02 ENCOUNTER — Other Ambulatory Visit: Payer: Self-pay | Admitting: Neurology

## 2021-10-02 MED ORDER — VITAMIN D (ERGOCALCIFEROL) 1.25 MG (50000 UNIT) PO CAPS: 50000.0000 [IU] | ORAL_CAPSULE | ORAL | 3 refills | Status: DC

## 2021-10-06 ENCOUNTER — Other Ambulatory Visit: Payer: Self-pay

## 2021-10-06 DIAGNOSIS — G35 Multiple sclerosis: Secondary | ICD-10-CM

## 2021-10-06 NOTE — Progress Notes (Signed)
Per Dr.Jaffe,  I would like to repeat MRI of brain, cervical and thoracic spine with and without contrast one week prior to follow up in October.   MRI ordered.

## 2021-10-22 ENCOUNTER — Ambulatory Visit (INDEPENDENT_AMBULATORY_CARE_PROVIDER_SITE_OTHER): Payer: BC Managed Care – PPO

## 2021-10-22 VITALS — BP 104/73 | HR 60 | Temp 98.0°F | Resp 18 | Ht 66.0 in | Wt 189.8 lb

## 2021-10-22 DIAGNOSIS — G35 Multiple sclerosis: Secondary | ICD-10-CM

## 2021-10-22 MED ORDER — SODIUM CHLORIDE 0.9 % IV SOLN
300.0000 mg | Freq: Once | INTRAVENOUS | Status: AC
  Administered 2021-10-22: 300 mg via INTRAVENOUS
  Filled 2021-10-22: qty 15

## 2021-10-22 MED ORDER — LORATADINE 10 MG PO TABS
10.0000 mg | ORAL_TABLET | Freq: Once | ORAL | Status: AC
  Administered 2021-10-22: 10 mg via ORAL
  Filled 2021-10-22: qty 1

## 2021-10-22 MED ORDER — ACETAMINOPHEN 325 MG PO TABS
650.0000 mg | ORAL_TABLET | Freq: Once | ORAL | Status: AC
  Administered 2021-10-22: 650 mg via ORAL
  Filled 2021-10-22: qty 2

## 2021-10-22 MED ORDER — SODIUM CHLORIDE 0.9 % IV SOLN: INTRAVENOUS | Status: DC

## 2021-10-22 NOTE — Progress Notes (Signed)
Diagnosis: Multiple Sclerosis  Provider:  Chilton Greathouse, MD  Procedure: Infusion  IV Type: Peripheral, IV Location: L Antecubital  Tysabri (Natalizumab), Dose: 300 mg  Infusion Start Time: 1004  Infusion Stop Time: 1108  Post Infusion IV Care: Observation period completed and Peripheral IV Discontinued  Discharge: Condition: Good, Destination: Home . AVS provided to patient.   Performed by:  Adriana Mccallum, RN

## 2021-10-23 ENCOUNTER — Other Ambulatory Visit: Payer: BC Managed Care – PPO

## 2021-10-29 ENCOUNTER — Other Ambulatory Visit: Payer: BC Managed Care – PPO

## 2021-11-05 ENCOUNTER — Other Ambulatory Visit: Payer: Self-pay | Admitting: Neurology

## 2021-11-05 MED ORDER — VITAMIN D (ERGOCALCIFEROL) 1.25 MG (50000 UNIT) PO CAPS: 50000.0000 [IU] | ORAL_CAPSULE | ORAL | 3 refills | Status: DC

## 2021-11-17 ENCOUNTER — Encounter: Payer: Self-pay | Admitting: Neurology

## 2021-11-19 ENCOUNTER — Ambulatory Visit: Payer: BC Managed Care – PPO

## 2021-11-21 ENCOUNTER — Ambulatory Visit (INDEPENDENT_AMBULATORY_CARE_PROVIDER_SITE_OTHER): Payer: BC Managed Care – PPO

## 2021-11-21 ENCOUNTER — Telehealth: Payer: Self-pay

## 2021-11-21 VITALS — BP 105/63 | HR 69 | Temp 98.5°F | Resp 18 | Ht 66.0 in | Wt 191.2 lb

## 2021-11-21 DIAGNOSIS — G35 Multiple sclerosis: Secondary | ICD-10-CM

## 2021-11-21 DIAGNOSIS — R413 Other amnesia: Secondary | ICD-10-CM

## 2021-11-21 MED ORDER — SODIUM CHLORIDE 0.9 % IV SOLN: INTRAVENOUS | Status: DC

## 2021-11-21 MED ORDER — LORATADINE 10 MG PO TABS
10.0000 mg | ORAL_TABLET | Freq: Once | ORAL | Status: AC
  Administered 2021-11-21: 10 mg via ORAL
  Filled 2021-11-21: qty 1

## 2021-11-21 MED ORDER — ACETAMINOPHEN 325 MG PO TABS
650.0000 mg | ORAL_TABLET | Freq: Once | ORAL | Status: AC
  Administered 2021-11-21: 650 mg via ORAL
  Filled 2021-11-21: qty 2

## 2021-11-21 MED ORDER — SODIUM CHLORIDE 0.9 % IV SOLN
300.0000 mg | Freq: Once | INTRAVENOUS | Status: AC
  Administered 2021-11-21: 300 mg via INTRAVENOUS
  Filled 2021-11-21: qty 15

## 2021-11-21 NOTE — Telephone Encounter (Signed)
Mychart message received from patient, The [facility] people suggested that I need to do cognitive thinking therapy but they couldn't send the referral. Instead I need the referral from you. Here is the faxing number for where I'm trying to go ([phone]). It's where I went the first time in therapy.   Referral sent to [facility]

## 2021-11-21 NOTE — Progress Notes (Signed)
Diagnosis: Multiple Sclerosis  Provider:  Chilton Greathouse, MD  Procedure: Infusion  IV Type: Peripheral, IV Location: L Antecubital  Tysabri (Natalizumab), Dose: 300 mg  Infusion Start Time: 0930  Infusion Stop Time: 1036  Post Infusion IV Care: Observation period completed and Peripheral IV Discontinued  Discharge: Condition: Good, Destination: Home . AVS provided to patient.   Performed by:  Adriana Mccallum, RN

## 2021-11-28 ENCOUNTER — Other Ambulatory Visit: Payer: Self-pay

## 2021-11-28 ENCOUNTER — Ambulatory Visit: Payer: BC Managed Care – PPO | Attending: Neurology | Admitting: Speech Pathology

## 2021-11-28 ENCOUNTER — Encounter: Payer: Self-pay | Admitting: Speech Pathology

## 2021-11-28 DIAGNOSIS — R413 Other amnesia: Secondary | ICD-10-CM

## 2021-11-28 DIAGNOSIS — F801 Expressive language disorder: Secondary | ICD-10-CM | POA: Diagnosis present

## 2021-11-28 DIAGNOSIS — R41841 Cognitive communication deficit: Secondary | ICD-10-CM | POA: Diagnosis present

## 2021-11-28 NOTE — Progress Notes (Signed)
Per Charm Barges   We are scheduled to see this patient tomorrow, however her referral states OT and her Dx is for ST. Please correct the referral for the service you are requesting to ST or submit a new referral asap to avoid appointment cancellation.  New order added for Speech  therapy

## 2021-11-28 NOTE — Therapy (Signed)
OUTPATIENT SPEECH LANGUAGE PATHOLOGY EVALUATION   Patient Name: Elizabeth Tran MRN: [id number] DOB:[birth year], 21 y.o., female Today's Date: 12/02/2021  PCP: Roycroft, Barbette Hair, MD REFERRING PROVIDER: Drema Dallas, DO   End of Session - 12/02/21 1418     Visit Number 1    Number of Visits 7    Date for SLP Re-Evaluation 01/13/22    Authorization Type BCBS    SLP Start Time 1400    SLP Stop Time  1445    SLP Time Calculation (min) 45 min    Activity Tolerance Patient tolerated treatment well             History reviewed. No pertinent past medical history. History reviewed. No pertinent surgical history. Patient Active Problem List   Diagnosis Date Noted   Depression 07/26/2021   Multiple sclerosis ([facility]) 07/25/2021   Normocytic anemia 07/25/2021   Hypokalemia 07/25/2021   Demyelinating disease of central nervous system ([facility]) 07/21/2021   MDD (major depressive disorder), recurrent severe, without psychosis ([facility]) 12/30/2020    ONSET DATE: Per pt, March 2023; referral received July 2023   REFERRING DIAG: R41.3 (ICD-10-CM) - Memory difficulty  THERAPY DIAG:  Cognitive communication deficit  Expressive language disorder  Rationale for Evaluation and Treatment Rehabilitation  SUBJECTIVE:   SUBJECTIVE STATEMENT: "I am having trouble knowing what I need to do in school" Pt accompanied by: self  PERTINENT HISTORY: dx with MS April 2023, since reports change in language and cognition skills impacting successful management of home activities and school workload.  PAIN:  Are you having pain? No   FALLS: Has patient fallen in last 6 months?  See PT evaluation for details  LIVING ENVIRONMENT: Lives with: lives with their family and lives with an adult companion Lives in: House/apartment  PLOF:  Level of assistance: Independent with IADLs Employment: Student   PATIENT GOALS "to be able to not have my head hurting and being frustrated knowing what to  say"  OBJECTIVE:   DIAGNOSTIC FINDINGS: 07/25/21 MR BRAIN W WO CONTRAST   IMPRESSION: 1. Extensive T2/FLAIR signal abnormality involving the supratentorial and infratentorial cerebral white matter, most concerning for demyelinating disease. Multiple scattered enhancing lesion involving the bilateral cerebral hemispheres and brainstem, consistent with active demyelination. 2. No other acute intracranial abnormality.  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 Cognition appears to be impacting communication skills d/t difficulty with memory, attention, and organization. Is neglecting daily activities such as medication administration and brushing teeth.   COGNITIVE COMMUNICATION Following directions: Follows multi-step commands with increased time  Auditory comprehension: Impaired: requires repetitions and extended processing time Verbal expression: Impaired: difficulty expressing to SLP challenges she has been having, loses train of thought, requires extended response time Functional communication: Impaired: decline in abilities to express thoughts, ideas and participate in social/school based communication  ORAL MOTOR EXAMINATION Overall status: Did not assess  STANDARDIZED ASSESSMENTS: CLQT: Language: WNL   PATIENT REPORTED OUTCOME MEASURES (PROM): Cognitive function: Short Form: 20 Primary challenges are slow thinking, working hard to pay attention to avoid mistakes, and difficulty learning novel tasks.   TODAY'S TREATMENT:  Evaluation, SLP provides education on observations and recommendations.  Discussed POC and goals, pt verbalizes agreement.    PATIENT EDUCATION:  Education details: see above Person educated: Patient Education method: Explanation, Demonstration, and Handouts Education comprehension: verbalized understanding, returned demonstration, and needs further education   GOALS: Goals reviewed with patient? Yes  SHORT TERM GOALS: Target date: 12/26/2021  Pt will teach back reading comprehension strategies to aid in processing of visually presented information with rare min-A.  Baseline: Goal status: INITIAL  2.  Pt will utilize communication strategies to present with [facility] verbal expression during moderately complex 10 minute conversation of self-selected topic with occasional min-A.  Baseline:  Goal status: INITIAL  3.  Pt will teach back attention and memory compensations/strategies with rare min-A.  Baseline:  Goal status: INITIAL  LONG TERM GOALS: Target date: 01/09/2022  Pt will demonstrate reading comprehension strategies through summarization of 1+ page of moderately complex reading material with rare min-A Baseline:  Goal status: INITIAL  2.  Pt will verbalize appropriate attention strategy to aid in attention during typically encountered tasks (conversations at home, school lecture, completing homework assignment) with rare min-A.  Baseline:  Goal status: INITIAL  3.  Pt will verbalize successful implementing of memory strategies at home, and describe how they have aided in successful schedule management of novel and routine tasks.  Baseline:  Goal status: INITIAL   ASSESSMENT:  CLINICAL IMPRESSION: Patient is a 21 y.o. F who was seen today for cognitive communication evaluation following MS dx. Pt reporting change in cognition, impacting communication across variety of communication partners and successful engagement in desired and routine activities. Pt with mild impairment in memory, attention, executive functioning, and verbal expression.  Pt  deficits characterized by difficulty forming coherent and cohesive verbal output which adequately relays desired message. Negative impact on ability to process, encode, and recall information learned during lectures, readings, and conversations. Resulting in adverse grades and needing to change career path following college graduation. Pt not writing poems any more d/t language and cognition impairment. CLQT administration initiated, will be completed during first therapy session. Language demonstrated WNL, however unlikely that tasks involved are adequate at representing communication environments and challenges pt will face as college level student. Demonstrates delayed processing throughout assessment. I recommend skilled ST to provide pt with strategies and compensation to aid in maintaining independence and compensating for cognition and language changes with progressive disorder.   OBJECTIVE IMPAIRMENTS include attention, memory, executive functioning, and expressive language. These impairments are limiting patient from return to work, household responsibilities, ADLs/IADLs, and effectively communicating at home and in community. Factors affecting potential to achieve goals and functional outcome are co-morbidities, medical prognosis, and family/community support.. Patient will benefit from skilled SLP services to address above impairments and improve overall function.  REHAB POTENTIAL: Good  PLAN: SLP FREQUENCY: 1x/week  SLP DURATION: 6 weeks  PLANNED INTERVENTIONS: Cueing hierachy, Cognitive reorganization, Internal/external aids, Functional tasks, SLP instruction and feedback, Compensatory strategies, and Patient/family education    Maia Breslow, CCC-SLP 12/02/2021, 2:49 PM

## 2021-12-10 ENCOUNTER — Other Ambulatory Visit: Payer: Self-pay

## 2021-12-10 ENCOUNTER — Encounter: Payer: Self-pay | Admitting: Neurology

## 2021-12-10 DIAGNOSIS — G379 Demyelinating disease of central nervous system, unspecified: Secondary | ICD-10-CM

## 2021-12-10 DIAGNOSIS — G35 Multiple sclerosis: Secondary | ICD-10-CM

## 2021-12-10 DIAGNOSIS — R413 Other amnesia: Secondary | ICD-10-CM

## 2021-12-10 MED ORDER — VITAMIN D (ERGOCALCIFEROL) 1.25 MG (50000 UNIT) PO CAPS: 50000.0000 [IU] | ORAL_CAPSULE | ORAL | 3 refills | Status: DC

## 2021-12-19 ENCOUNTER — Ambulatory Visit (INDEPENDENT_AMBULATORY_CARE_PROVIDER_SITE_OTHER): Payer: BC Managed Care – PPO

## 2021-12-19 ENCOUNTER — Ambulatory Visit: Payer: BC Managed Care – PPO | Admitting: Speech Pathology

## 2021-12-19 VITALS — BP 105/70 | HR 67 | Temp 98.5°F | Resp 20 | Ht 66.0 in | Wt 192.0 lb

## 2021-12-19 DIAGNOSIS — R41841 Cognitive communication deficit: Secondary | ICD-10-CM

## 2021-12-19 DIAGNOSIS — G35 Multiple sclerosis: Secondary | ICD-10-CM | POA: Diagnosis not present

## 2021-12-19 DIAGNOSIS — F801 Expressive language disorder: Secondary | ICD-10-CM

## 2021-12-19 MED ORDER — SODIUM CHLORIDE 0.9 % IV SOLN: INTRAVENOUS | Status: DC

## 2021-12-19 MED ORDER — ACETAMINOPHEN 325 MG PO TABS
650.0000 mg | ORAL_TABLET | Freq: Once | ORAL | Status: AC
  Administered 2021-12-19: 650 mg via ORAL
  Filled 2021-12-19: qty 2

## 2021-12-19 MED ORDER — SODIUM CHLORIDE 0.9 % IV SOLN
300.0000 mg | Freq: Once | INTRAVENOUS | Status: AC
  Administered 2021-12-19: 300 mg via INTRAVENOUS
  Filled 2021-12-19: qty 15

## 2021-12-19 MED ORDER — LORATADINE 10 MG PO TABS
10.0000 mg | ORAL_TABLET | Freq: Once | ORAL | Status: AC
  Administered 2021-12-19: 10 mg via ORAL
  Filled 2021-12-19: qty 1

## 2021-12-19 NOTE — Progress Notes (Signed)
Diagnosis: Multiple Sclerosis  Provider:  Chilton Greathouse MD  Procedure: Infusion  IV Type: Peripheral, IV Location: L Hand  Tysabri (Natalizumab), Dose: 300 mg  Infusion Start Time: 0955  Infusion Stop Time: 1100  Post Infusion IV Care: 60 minutes Observation period completed and Peripheral IV Discontinued  Discharge: Condition: Good, Destination: Home . AVS provided to patient.   Performed by:  Garnette Czech, RN

## 2021-12-19 NOTE — Patient Instructions (Signed)
Getting all school work done:   [employer]- dedicate 30-45 minutes of focus for school work, take cognitive break (listen to music, go for walk, eat a snack, sweep the floor, etc.) Create a weekly routine for checking what you need to complete (Sunday is probably a good day for this) Consider printing out course syllabi to have as reference for due dates  Write it down somewhere Put due dates with alert into your calendar or reminders app  Weekly to-do list with the due dates listed  Create a plan/schedule for when you'll work on the things you need to do Make sure you're planning for enough time to complete all the things you need/want to do.  As you finish and turn in items, cross them off your list or mark them off on your calendar/reminders/notes app   you also may need to schedule and plan for items that you don't turn in but that take your time Plan for down time too-- phone calls with mom, quality time with your boyfriend, going for a walk, getting to [employer], going out to eat   Consider reading the chapters for class prior to the lecture. This has potential to aid in your understanding of the information being presented.  May want to test this practice to see if it's beneficial for you

## 2021-12-19 NOTE — Therapy (Signed)
OUTPATIENT SPEECH LANGUAGE PATHOLOGY TREATMENT   Patient Name: Elizabeth Tran MRN: [id number] DOB:Sep 25, 2000, 21 y.o., female Today's Date: 12/19/2021  PCP: Roycroft, Barbette Hair, MD REFERRING PROVIDER: Drema Dallas, DO   End of Session - 12/19/21 1347     Visit Number 2    Number of Visits 7    Date for SLP Re-Evaluation 01/13/22    Authorization Type BCBS    SLP Start Time 1347    SLP Stop Time  1431    SLP Time Calculation (min) 44 min    Activity Tolerance Patient tolerated treatment well              No past medical history on file. No past surgical history on file. Patient Active Problem List   Diagnosis Date Noted   Depression 07/26/2021   Multiple sclerosis (HCC) 07/25/2021   Normocytic anemia 07/25/2021   Hypokalemia 07/25/2021   Demyelinating disease of central nervous system (HCC) 07/21/2021   MDD (major depressive disorder), recurrent severe, without psychosis (HCC) 12/30/2020    ONSET DATE: Per pt, March 2023; referral received July 2023   REFERRING DIAG: R41.3 (ICD-10-CM) - Memory difficulty  THERAPY DIAG:  Cognitive communication deficit  Expressive language disorder  Rationale for Evaluation and Treatment Rehabilitation  SUBJECTIVE:   SUBJECTIVE STATEMENT: "They're going ok"    PAIN:  Are you having pain? No  OBJECTIVE:   TODAY'S TREATMENT:  12-19-21: Pt reports returning to school, feeling overwhelmed with course load. SLP led pt through discussion to identify strategies to aid in completion of required course work. See pt instructions. Pt able to teach back with occasional min-A, questioning cues. Following discussion, pt able to open course in phone and ID x4 assignments upcoming in next 2 weeks with mod-I. Given mod-A for ID work not explicitly identified with due date in syllabus, pt able to estimate time will take to complete assignments identified. SLP provided education on strategy of pre-reading to aid in retention of lecture  information. SLP provided pt with handout with study strategies. Pt to read and highlight, and bring back with her following session.   PATIENT EDUCATION: Education details: see above Person educated: Patient Education method: Explanation, Demonstration, and Handouts Education comprehension: verbalized understanding, returned demonstration, and needs further education   GOALS: Goals reviewed with patient? Yes  SHORT TERM GOALS: Target date: 12/26/2021  Pt will teach back reading comprehension strategies to aid in processing of visually presented information with rare min-A.  Baseline: Goal status: IN PROGRESS  2.  Pt will utilize communication strategies to present with [facility] verbal expression during moderately complex 10 minute conversation of self-selected topic with occasional min-A.  Baseline:  Goal status: IN PROGRESS  3.  Pt will teach back attention and memory compensations/strategies with rare min-A.  Baseline:  Goal status: IN PROGRESS  LONG TERM GOALS: Target date: 01/09/2022  Pt will demonstrate reading comprehension strategies through summarization of 1+ page of moderately complex reading material with rare min-A Baseline:  Goal status: IN PROGRESS  2.  Pt will verbalize appropriate attention strategy to aid in attention during typically encountered tasks (conversations at home, school lecture, completing homework assignment) with rare min-A.  Baseline:  Goal status: IN PROGRESS  3.  Pt will verbalize successful implementing of memory strategies at home, and describe how they have aided in successful schedule management of novel and routine tasks.  Baseline:  Goal status: IN PROGRESS   ASSESSMENT:  CLINICAL IMPRESSION: Patient received skilled ST treatment for cognition and verbal  expression to maximize participation and successful completion of daily activities and college level school work. Treatment today focused on initial education and systematic instruction  for use of external cognitive aids and compensations/strategies to aid in successful completion of all required tasks for enrolled classes. Pt will continue to benefit from skilled ST to address cognitive and expressive language impairments.   OBJECTIVE IMPAIRMENTS include attention, memory, executive functioning, and expressive language. These impairments are limiting patient from return to work, household responsibilities, ADLs/IADLs, and effectively communicating at home and in community. Factors affecting potential to achieve goals and functional outcome are co-morbidities, medical prognosis, and family/community support.. Patient will benefit from skilled SLP services to address above impairments and improve overall function.  REHAB POTENTIAL: Good  PLAN: SLP FREQUENCY: 1x/week  SLP DURATION: 6 weeks  PLANNED INTERVENTIONS: Cueing hierachy, Cognitive reorganization, Internal/external aids, Functional tasks, SLP instruction and feedback, Compensatory strategies, and Patient/family education    Maia Breslow, CCC-SLP 12/19/2021, 1:47 PM

## 2021-12-25 NOTE — Progress Notes (Signed)
[place] [employer] forms filled out. Forms scanned in chart and at the front desk for patient to pick up.  Patient aware.

## 2021-12-26 ENCOUNTER — Ambulatory Visit: Payer: BC Managed Care – PPO | Attending: Neurology | Admitting: Speech Pathology

## 2021-12-26 DIAGNOSIS — R41841 Cognitive communication deficit: Secondary | ICD-10-CM | POA: Insufficient documentation

## 2021-12-26 DIAGNOSIS — F801 Expressive language disorder: Secondary | ICD-10-CM | POA: Insufficient documentation

## 2021-12-26 NOTE — Therapy (Signed)
OUTPATIENT SPEECH LANGUAGE PATHOLOGY TREATMENT   Patient Name: Elizabeth Tran MRN: [id number] DOB:June 05, 2000, 21 y.o., female Today's Date: 12/26/2021  PCP: Roycroft, Sandy Salaam, MD REFERRING PROVIDER: Pieter Partridge, DO   End of Session - 12/26/21 1349     Visit Number 3    Number of Visits 7    Date for SLP Re-Evaluation 01/13/22    Authorization Type BCBS    SLP Start Time 0092    SLP Stop Time  1434    SLP Time Calculation (min) 45 min    Activity Tolerance Patient tolerated treatment well               No past medical history on file. No past surgical history on file. Patient Active Problem List   Diagnosis Date Noted   Depression 07/26/2021   Multiple sclerosis ([place]) 07/25/2021   Normocytic anemia 07/25/2021   Hypokalemia 07/25/2021   Demyelinating disease of central nervous system ([place]) 07/21/2021   MDD (major depressive disorder), recurrent severe, without psychosis ([place]) 12/30/2020    ONSET DATE: Per pt, March 2023; referral received July 2023   REFERRING DIAG: R41.3 (ICD-10-CM) - Memory difficulty  THERAPY DIAG:  Cognitive communication deficit  Expressive language disorder  Rationale for Evaluation and Treatment Rehabilitation  SUBJECTIVE:   SUBJECTIVE STATEMENT: "I've had a bad start"    PAIN:  Are you having pain? No  OBJECTIVE:   TODAY'S TREATMENT:  12-26-21: Pt reports to being behind in classes d/t having added class late. Pt is using Alexa to give you audible reminders of things you need to get done. Pt is using to remind her to log into virtual classes, to remind of due dates, and to remind her of scheduled work time. Pt able to teach back scheduling and written reminders strategies discussion previous session. Pt is recording lectures, reports to limited instances of re-listening to those lectures. Pt tells SLP she feels like she is "somewhat retaining the information really well." Pt reports that being tired is a barrier to being  attentive. SLP suggests that pt trial adding 5 minute walk to class to aid in alertness and prime brain for focus, as exercise has been shown to increase brain's capacity to pay attention. Generated alternative strategy for schedule and to-do list management. See pt instructions.   12-19-21: Pt reports returning to school, feeling overwhelmed with course load. SLP led pt through discussion to identify strategies to aid in completion of required course work. See pt instructions. Pt able to teach back with occasional min-A, questioning cues. Following discussion, pt able to open course in phone and ID x4 assignments upcoming in next 2 weeks with mod-I. Given mod-A for ID work not explicitly identified with due date in syllabus, pt able to estimate time will take to complete assignments identified. SLP provided education on strategy of pre-reading to aid in retention of lecture information. SLP provided pt with handout with study strategies. Pt to read and highlight, and bring back with her following session.   PATIENT EDUCATION: Education details: see above Person educated: Patient Education method: Explanation, Demonstration, and Handouts Education comprehension: verbalized understanding, returned demonstration, and needs further education   GOALS: Goals reviewed with patient? Yes  SHORT TERM GOALS: Target date: 12/26/2021  Pt will teach back reading comprehension strategies to aid in processing of visually presented information with rare min-A.  Baseline: 12-26-21 Goal status: MET  2.  Pt will utilize communication strategies to present with [facility] verbal expression during moderately complex 10 minute  conversation of self-selected topic with occasional min-A.  Baseline: 12-26-21 Goal status: MET  3.  Pt will teach back attention and memory compensations/strategies with rare min-A.  Baseline: 12-26-21 Goal status: MET  LONG TERM GOALS: Target date: 01/09/2022  Pt will demonstrate reading  comprehension strategies through summarization of 1+ page of moderately complex reading material with rare min-A Baseline:  Goal status: IN PROGRESS  2.  Pt will verbalize appropriate attention strategy to aid in attention during typically encountered tasks (conversations at home, school lecture, completing homework assignment) with rare min-A.  Baseline:  Goal status: IN PROGRESS  3.  Pt will verbalize successful implementing of memory strategies at home, and describe how they have aided in successful schedule management of novel and routine tasks.  Baseline:  Goal status: IN PROGRESS   ASSESSMENT:  CLINICAL IMPRESSION: Patient received skilled ST treatment for cognition and verbal expression to maximize participation and successful completion of daily activities and college level school work. Treatment today focused on initial education and systematic instruction for use of external cognitive aids and compensations/strategies to aid in successful completion of all required tasks for enrolled classes. Pt will continue to benefit from skilled ST to address cognitive and expressive language impairments.   OBJECTIVE IMPAIRMENTS include attention, memory, executive functioning, and expressive language. These impairments are limiting patient from return to work, household responsibilities, ADLs/IADLs, and effectively communicating at home and in community. Factors affecting potential to achieve goals and functional outcome are co-morbidities, medical prognosis, and family/community support.. Patient will benefit from skilled SLP services to address above impairments and improve overall function.  REHAB POTENTIAL: Good  PLAN: SLP FREQUENCY: 1x/week  SLP DURATION: 6 weeks  PLANNED INTERVENTIONS: Cueing hierachy, Cognitive reorganization, Internal/external aids, Functional tasks, SLP instruction and feedback, Compensatory strategies, and Patient/family education    Su Monks,  CCC-SLP 12/26/2021, 2:34 PM

## 2021-12-26 NOTE — Patient Instructions (Signed)
Attention for class/lectures: Consider adding short bout of exercise prior to needing to focus. Exercise has been shown to improve our brain's ability to focus and work well. Consider walking a few blocks to 8AM class or doing jumping jacks prior to virtual class.   Retaining/Learning Information in class: Listen and take notes Take a break Re-listen to lecture recording, referring to and editing your notes if needed Take a break See what you recall You may need to revisit the information a third or fourth time. Base this off what you recall during last step.    When you don't feel motivated to complete your work: Take a moment to imagine the outcome if you DONT complete the thing.   For example: failing the class, not graduating, getting a bad grade This can motivate you to do the work  Get a planner: Use the month to plan out appointment Use days to form your to-do lists based on when they're due  To-do list Remember IMPORTANT and TIMELY

## 2022-01-02 ENCOUNTER — Ambulatory Visit: Payer: BC Managed Care – PPO | Admitting: Speech Pathology

## 2022-01-02 DIAGNOSIS — R41841 Cognitive communication deficit: Secondary | ICD-10-CM | POA: Diagnosis not present

## 2022-01-02 DIAGNOSIS — F801 Expressive language disorder: Secondary | ICD-10-CM

## 2022-01-02 NOTE — Patient Instructions (Signed)
Work for 30 minutes then take a break.  Set a timer for your break to keep you on track.    Reading: work in chunks, use your timer to keep you on track.    Taking notes: Print 4 slides to a page  Take notes under the applicable slide  Print -> halfway down change "Print full slide" to "print 4 slides per page" -> under that change to print on both side   Emailing professor of [employer] class  Compose professional and courteous email requesting access to powerpoint prior to class. Tell her it helps you with note taking and understanding information as it's being presented.   Speechify app for things online or on computer to read to you.

## 2022-01-02 NOTE — Therapy (Signed)
OUTPATIENT SPEECH LANGUAGE PATHOLOGY TREATMENT   Patient Name: Elizabeth Tran MRN: [id number] DOB:09/09/2000, 21 y.o., female Today's Date: 01/02/2022  PCP: Roycroft, Sandy Salaam, MD REFERRING PROVIDER: Pieter Partridge, DO   End of Session - 01/02/22 1448     Visit Number 4    Number of Visits 7    Date for SLP Re-Evaluation 01/13/22    Authorization Type BCBS    SLP Start Time 1448    SLP Stop Time  9735    SLP Time Calculation (min) 42 min    Activity Tolerance Patient tolerated treatment well                No past medical history on file. No past surgical history on file. Patient Active Problem List   Diagnosis Date Noted   Depression 07/26/2021   Multiple sclerosis ([place]) 07/25/2021   Normocytic anemia 07/25/2021   Hypokalemia 07/25/2021   Demyelinating disease of central nervous system ([place]) 07/21/2021   MDD (major depressive disorder), recurrent severe, without psychosis ([place]) 12/30/2020    ONSET DATE: Per pt, March 2023; referral received July 2023   REFERRING DIAG: R41.3 (ICD-10-CM) - Memory difficulty  THERAPY DIAG:  Cognitive communication deficit  Expressive language disorder  Rationale for Evaluation and Treatment Rehabilitation  SUBJECTIVE:   SUBJECTIVE STATEMENT: "I fell yesterday"    PAIN:  Are you having pain? No head "a little throb"  OBJECTIVE:   TODAY'S TREATMENT:  01-02-22: Generated person centered study strategies to aid in successful comprehension of presented material across pt's classes. Pt ID scheduling work time, timing breaks to avoid getting off track, studying as she learns, reviewing information including recorded lectures, using speechify if able. Education on internal memory strategies which may prove helpful- repeating to self, generating mnemonic, teach back strategy. Pt benefits from singing spelling of tricky word which resulted in recall of accurate spelling following 20 minute latency period for word previously unable to  recall. Tell SLP "that made it easy!" SLP facilitated pt being able to print powerpoint slides in a manner which can facilitate improved note taking in class, reducing time spent writing material on slides. Following, pt demonstrates with rare min-A. SLP provided handout with steps. Generated alternative scheduling strategy of using weekly planner print out as pt has been unable to buy planner. Pt ID x6 items to schedule across next week with supervision A.  12-26-21: Pt reports to being behind in classes d/t having added class late. Pt is using Alexa to give you audible reminders of things you need to get done. Pt is using to remind her to log into virtual classes, to remind of due dates, and to remind her of scheduled work time. Pt able to teach back scheduling and written reminders strategies discussion previous session. Pt is recording lectures, reports to limited instances of re-listening to those lectures. Pt tells SLP she feels like she is "somewhat retaining the information really well." Pt reports that being tired is a barrier to being attentive. SLP suggests that pt trial adding 5 minute walk to class to aid in alertness and prime brain for focus, as exercise has been shown to increase brain's capacity to pay attention. Generated alternative strategy for schedule and to-do list management. See pt instructions.   12-19-21: Pt reports returning to school, feeling overwhelmed with course load. SLP led pt through discussion to identify strategies to aid in completion of required course work. See pt instructions. Pt able to teach back with occasional min-A, questioning cues. Following  discussion, pt able to open course in phone and ID x4 assignments upcoming in next 2 weeks with mod-I. Given mod-A for ID work not explicitly identified with due date in syllabus, pt able to estimate time will take to complete assignments identified. SLP provided education on strategy of pre-reading to aid in retention of lecture  information. SLP provided pt with handout with study strategies. Pt to read and highlight, and bring back with her following session.   PATIENT EDUCATION: Education details: see above Person educated: Patient Education method: Explanation, Demonstration, and Handouts Education comprehension: verbalized understanding, returned demonstration, and needs further education   GOALS: Goals reviewed with patient? Yes  SHORT TERM GOALS: Target date: 12/26/2021  Pt will teach back reading comprehension strategies to aid in processing of visually presented information with rare min-A.  Baseline: 12-26-21 Goal status: MET  2.  Pt will utilize communication strategies to present with [facility] verbal expression during moderately complex 10 minute conversation of self-selected topic with occasional min-A.  Baseline: 12-26-21 Goal status: MET  3.  Pt will teach back attention and memory compensations/strategies with rare min-A.  Baseline: 12-26-21 Goal status: MET  LONG TERM GOALS: Target date: 01/09/2022  Pt will demonstrate reading comprehension strategies through summarization of 1+ page of moderately complex reading material with rare min-A Baseline:  Goal status: IN PROGRESS  2.  Pt will verbalize appropriate attention strategy to aid in attention during typically encountered tasks (conversations at home, school lecture, completing homework assignment) with rare min-A.  Baseline:  Goal status: IN PROGRESS  3.  Pt will verbalize successful implementing of memory strategies at home, and describe how they have aided in successful schedule management of novel and routine tasks.  Baseline:  Goal status: IN PROGRESS   ASSESSMENT:  CLINICAL IMPRESSION: Patient received skilled ST treatment for cognition and verbal expression to maximize participation and successful completion of daily activities and college level school work. Treatment today focused on initial education and systematic instruction for  use of external cognitive aids and compensations/strategies to aid in successful completion of all required tasks for enrolled classes. Pt will continue to benefit from skilled ST to address cognitive and expressive language impairments.   OBJECTIVE IMPAIRMENTS include attention, memory, executive functioning, and expressive language. These impairments are limiting patient from return to work, household responsibilities, ADLs/IADLs, and effectively communicating at home and in community. Factors affecting potential to achieve goals and functional outcome are co-morbidities, medical prognosis, and family/community support.. Patient will benefit from skilled SLP services to address above impairments and improve overall function.  REHAB POTENTIAL: Good  PLAN: SLP FREQUENCY: 1x/week  SLP DURATION: 6 weeks  PLANNED INTERVENTIONS: Cueing hierachy, Cognitive reorganization, Internal/external aids, Functional tasks, SLP instruction and feedback, Compensatory strategies, and Patient/family education    Su Monks, CCC-SLP 01/02/2022, 2:48 PM

## 2022-01-09 ENCOUNTER — Ambulatory Visit: Payer: BC Managed Care – PPO | Admitting: Speech Pathology

## 2022-01-09 DIAGNOSIS — R41841 Cognitive communication deficit: Secondary | ICD-10-CM

## 2022-01-09 DIAGNOSIS — F801 Expressive language disorder: Secondary | ICD-10-CM

## 2022-01-09 NOTE — Therapy (Signed)
OUTPATIENT SPEECH LANGUAGE PATHOLOGY TREATMENT   Patient Name: Elizabeth Tran MRN: [id number] DOB:10-27-2000, 21 y.o., female Today's Date: 01/09/2022  PCP: Roycroft, Sandy Salaam, MD REFERRING PROVIDER: Pieter Partridge, DO   End of Session - 01/09/22 1452     Visit Number 5    Number of Visits 7    Date for SLP Re-Evaluation 01/13/22    Authorization Type [employer]    SLP Start Time 1448    SLP Stop Time  9211    SLP Time Calculation (min) 42 min    Activity Tolerance Patient tolerated treatment well                 No past medical history on file. No past surgical history on file. Patient Active Problem List   Diagnosis Date Noted   Depression 07/26/2021   Multiple sclerosis ([place]) 07/25/2021   Normocytic anemia 07/25/2021   Hypokalemia 07/25/2021   Demyelinating disease of central nervous system ([place]) 07/21/2021   MDD (major depressive disorder), recurrent severe, without psychosis ([place]) 12/30/2020    ONSET DATE: Per pt, March 2023; referral received July 2023   REFERRING DIAG: R41.3 (ICD-10-CM) - Memory difficulty  THERAPY DIAG:  Cognitive communication deficit  Expressive language disorder  Rationale for Evaluation and Treatment Rehabilitation  SUBJECTIVE:   SUBJECTIVE STATEMENT: "I didn't pass my test"    PAIN:  Are you having pain? No   OBJECTIVE:   TODAY'S TREATMENT:  01-09-22: Pt reports to having failed test this week, reports she did not read the book. Generate strategies for reading to increase pt's adherence to required reading: use text to speech if able, set time limits for reading- especially those that you're not that interested in, read out loud to your self. Reviewed strategy of visualizing failure to increase motivation to do undesired tasks. Pt reports this has been previously helpful. Pt reports to using weekly planner, reports to completing most scheduled tasks. Pt is avoiding studying and reading. Targeted short term recall and reading  strategies through reading of short paragraph. Pt demonstrates use of previously addressed strategy of limiting distractions and reading out loud with supervision-A. Recalls 3/4 details from article, demonstrated through answering questions.  PATIENT EDUCATION: Education details: see above Person educated: Patient Education method: Explanation, Demonstration, and Handouts Education comprehension: verbalized understanding, returned demonstration, and needs further education   GOALS: Goals reviewed with patient? Yes  SHORT TERM GOALS: Target date: 12/26/2021  Pt will teach back reading comprehension strategies to aid in processing of visually presented information with rare min-A.  Baseline: 12-26-21 Goal status: MET  2.  Pt will utilize communication strategies to present with [facility] verbal expression during moderately complex 10 minute conversation of self-selected topic with occasional min-A.  Baseline: 12-26-21 Goal status: MET  3.  Pt will teach back attention and memory compensations/strategies with rare min-A.  Baseline: 12-26-21 Goal status: MET  LONG TERM GOALS: Target date: 01/09/2022  Pt will demonstrate reading comprehension strategies through summarization of 1+ page of moderately complex reading material with rare min-A Baseline:  Goal status: IN PROGRESS  2.  Pt will verbalize appropriate attention strategy to aid in attention during typically encountered tasks (conversations at home, school lecture, completing homework assignment) with rare min-A.  Baseline:  Goal status: IN PROGRESS  3.  Pt will verbalize successful implementing of memory strategies at home, and describe how they have aided in successful schedule management of novel and routine tasks.  Baseline: 01/09/22 Goal status: IN PROGRESS   ASSESSMENT:  CLINICAL IMPRESSION: Patient received skilled ST treatment for cognition and verbal expression to maximize participation and successful completion of daily  activities and college level school work. Treatment today focused on initial education and systematic instruction for use of external cognitive aids and compensations/strategies to aid in successful completion of all required tasks for enrolled classes. Pt will continue to benefit from skilled ST to address cognitive and expressive language impairments.   OBJECTIVE IMPAIRMENTS include attention, memory, executive functioning, and expressive language. These impairments are limiting patient from return to work, household responsibilities, ADLs/IADLs, and effectively communicating at home and in community. Factors affecting potential to achieve goals and functional outcome are co-morbidities, medical prognosis, and family/community support.. Patient will benefit from skilled SLP services to address above impairments and improve overall function.  REHAB POTENTIAL: Good  PLAN: SLP FREQUENCY: 1x/week  SLP DURATION: 6 weeks  PLANNED INTERVENTIONS: Cueing hierachy, Cognitive reorganization, Internal/external aids, Functional tasks, SLP instruction and feedback, Compensatory strategies, and Patient/family education    Su Monks, CCC-SLP 01/09/2022, 2:53 PM

## 2022-01-14 ENCOUNTER — Encounter: Payer: Self-pay | Admitting: Neurology

## 2022-01-16 ENCOUNTER — Ambulatory Visit (INDEPENDENT_AMBULATORY_CARE_PROVIDER_SITE_OTHER): Payer: BC Managed Care – PPO

## 2022-01-16 ENCOUNTER — Ambulatory Visit: Payer: BC Managed Care – PPO | Admitting: Speech Pathology

## 2022-01-16 VITALS — BP 107/70 | HR 61 | Temp 98.4°F | Resp 20 | Ht 66.0 in | Wt 195.6 lb

## 2022-01-16 DIAGNOSIS — R41841 Cognitive communication deficit: Secondary | ICD-10-CM

## 2022-01-16 DIAGNOSIS — G35 Multiple sclerosis: Secondary | ICD-10-CM

## 2022-01-16 DIAGNOSIS — F801 Expressive language disorder: Secondary | ICD-10-CM

## 2022-01-16 MED ORDER — SODIUM CHLORIDE 0.9 % IV SOLN: INTRAVENOUS | Status: DC

## 2022-01-16 MED ORDER — ACETAMINOPHEN 325 MG PO TABS
650.0000 mg | ORAL_TABLET | Freq: Once | ORAL | Status: AC
  Administered 2022-01-16: 650 mg via ORAL
  Filled 2022-01-16: qty 2

## 2022-01-16 MED ORDER — LORATADINE 10 MG PO TABS
10.0000 mg | ORAL_TABLET | Freq: Once | ORAL | Status: AC
  Administered 2022-01-16: 10 mg via ORAL
  Filled 2022-01-16: qty 1

## 2022-01-16 MED ORDER — SODIUM CHLORIDE 0.9 % IV SOLN
300.0000 mg | Freq: Once | INTRAVENOUS | Status: AC
  Administered 2022-01-16: 300 mg via INTRAVENOUS
  Filled 2022-01-16: qty 15

## 2022-01-16 NOTE — Therapy (Signed)
OUTPATIENT SPEECH LANGUAGE PATHOLOGY TREATMENT (DISCHARGE)   Patient Name: Elizabeth Tran MRN: [id number] DOB:11-Dec-2000, 21 y.o., female Today's Date: 01/20/2022  PCP: Roycroft, Sandy Salaam, MD REFERRING PROVIDER: Pieter Partridge, DO          No past medical history on file. No past surgical history on file. Patient Active Problem List   Diagnosis Date Noted   Depression 07/26/2021   Multiple sclerosis ([place]) 07/25/2021   Normocytic anemia 07/25/2021   Hypokalemia 07/25/2021   Demyelinating disease of central nervous system ([place]) 07/21/2021   MDD (major depressive disorder), recurrent severe, without psychosis ([place]) 12/30/2020    ONSET DATE: Per pt, March 2023; referral received July 2023   REFERRING DIAG: R41.3 (ICD-10-CM) - Memory difficulty  THERAPY DIAG:  Cognitive communication deficit  Expressive language disorder  Rationale for Evaluation and Treatment Rehabilitation  SUBJECTIVE:   SUBJECTIVE STATEMENT: "I got my test back and I got a B"   PAIN:  Are you having pain? No   SPEECH THERAPY DISCHARGE SUMMARY  Visits from Start of Care: 6  Current functional level related to goals / functional outcomes: Employing scheduling strategies to aid in successful completion of required tasks, demonstrating within gross normal limits expressive language during conversations in therapy, is employing memory and attention compensations to aid in daily functioning   Remaining deficits: Cognitive deficits in attention, memory, executive functioning   Education / Equipment: External cognitive aid selection and implementation training, reading comprehension and study strategies, verbal expression strategies to aid in organization and cohesiveness of discourse, brain health habits, HEP   Patient agrees to discharge. Patient goals were met. Patient is being discharged due to meeting the stated rehab goals.  OBJECTIVE:   TODAY'S TREATMENT:  01-16-22: Pt reporting she has  read this week's text, and you took notes while she read. Read in entirety, then re-read through. With supervision-A, pt verbalizes reading comprehension strategies which have proven successful in comprehension and retention of chapter in [employer] book. Is able to summary chapter to SLP with use of questioning cues. SLP has not read chapter but pt endorses satisfaction with how material was relayed and her ability to succeed on upcoming quiz assessing knowledge. SLP provided re-education on spaced learning principles and encouraged pt to continue implementing strategies at home. Pt reporting she requested the slides, professor gave them to her. Tamina used those slides to take notes during class. Is recording lectures and listening to those lectures if she feels like she missed something. SLP provided education for sleep hygiene. Use of planner ID by pt as helpful strategy for attention and recall of required daily tasks. Has not yet purchased. SLP provides re-education on strategies to maximize effectiveness, pt verbalizes understanding via teach back. Pt reports confidence in ability to employ targeted strategies to successfully participate in school based activities. Is pleased with current functional level, agreeable to ST d/c. Readministration of cognitive function short form with pt report improvement. Scores 31 today vs 20 on evaluation. Reporting improvements in slowed thinking, ability to pay attention, and ability to learn new things.   01-09-22: Pt reports to having failed test this week, reports she did not read the book. Generate strategies for reading to increase pt's adherence to required reading: use text to speech if able, set time limits for reading- especially those that you're not that interested in, read out loud to your self. Reviewed strategy of visualizing failure to increase motivation to do undesired tasks. Pt reports this has been previously helpful. Pt reports  to using weekly planner,  reports to completing most scheduled tasks. Pt is avoiding studying and reading. Targeted short term recall and reading strategies through reading of short paragraph. Pt demonstrates use of previously addressed strategy of limiting distractions and reading out loud with supervision-A. Recalls 3/4 details from article, demonstrated through answering questions.   PATIENT EDUCATION: Education details: see above Person educated: Patient Education method: Explanation, Demonstration, and Handouts Education comprehension: verbalized understanding, returned demonstration, and needs further education   GOALS: Goals reviewed with patient? Yes  SHORT TERM GOALS: Target date: 12/26/2021  Pt will teach back reading comprehension strategies to aid in processing of visually presented information with rare min-A.  Baseline: 12-26-21 Goal status: MET  2.  Pt will utilize communication strategies to present with [facility] verbal expression during moderately complex 10 minute conversation of self-selected topic with occasional min-A.  Baseline: 12-26-21 Goal status: MET  3.  Pt will teach back attention and memory compensations/strategies with rare min-A.  Baseline: 12-26-21 Goal status: MET  LONG TERM GOALS: Target date: 01/20/22 (+1 week for d/c therapy session)  Pt will demonstrate reading comprehension strategies through summarization of 1+ page of moderately complex reading material with rare min-A Baseline: 01-16-22 Goal status: MET  2.  Pt will verbalize appropriate attention strategy to aid in attention during typically encountered tasks (conversations at home, school lecture, completing homework assignment) with rare min-A.  Baseline: 01-16-22 Goal status: MET  3.  Pt will verbalize successful implementing of memory strategies at home, and describe how they have aided in successful schedule management of novel and routine tasks.  Baseline: 01/09/22 Goal status: MET   ASSESSMENT:  CLINICAL  IMPRESSION: Patient received skilled ST treatment for cognition and verbal expression to maximize participation and successful completion of daily activities and college level school work. SLP has provided pt with education and systematic instruction for use of external cognitive aids and compensations/strategies to aid in successful completion of all required tasks for enrolled classes. Pt endorses confidence in ability to use targeted compensations. Is agreeable to ST d/c at this time, verbalizes appreciation for targeted interventions.   OBJECTIVE IMPAIRMENTS include attention, memory, executive functioning, and expressive language. These impairments are limiting patient from return to work, household responsibilities, ADLs/IADLs, and effectively communicating at home and in community. Factors affecting potential to achieve goals and functional outcome are co-morbidities, medical prognosis, and family/community support. Patient will benefit from skilled SLP services to address above impairments and improve overall function.  REHAB POTENTIAL: Good  PLAN: SLP FREQUENCY: 1x/week  SLP DURATION: 6 weeks  PLANNED INTERVENTIONS: Cueing hierachy, Cognitive reorganization, Internal/external aids, Functional tasks, SLP instruction and feedback, Compensatory strategies, and Patient/family education  Su Monks, CCC-SLP 01/20/2022, 10:03 AM

## 2022-01-16 NOTE — Progress Notes (Signed)
Diagnosis: Multiple Sclerosis  Provider:  Marshell Garfinkel MD  Procedure: Infusion  IV Type: Peripheral, IV Location: L Hand  Tysabri (Natalizumab), Dose: 300 mg  Infusion Start Time: 7371  Infusion Stop Time: 1050  Post Infusion IV Care: Observation period completed and Peripheral IV Discontinued  Discharge: Condition: Good, Destination: Home . AVS provided to patient.   Performed by:  Koren Shiver, RN

## 2022-01-16 NOTE — Patient Instructions (Signed)
Why Sleep? Sleep is when our brain is able to recharge and recover from our day Sleep is when our memories encode into long term memory!  During sleep our mind is able to work through stressful situations or problems we've faced and remove the emotionality form the situation. This is why you wake up feeling clear headed regarding a stressful or emotional situation When we are fatigued, we have a much harder time focusing (and remember-- attention is so important for every other thinking task we need to do!)   Sleep Hygiene Recommendations:  Create a sleep routine. It's best to go to bed and wake at same time daily.  Avoid using cell phone 2 hours prior to bedtime. Typically TV is ok, because it is a more passive activity Think about sleep environment: dark room, cool (65-68 degrees). If room is not dark enough, consider a sleep mask Viewing 2-10 minutes of morning light and sunset light can help set your circadian rhythm.  While viewing morning and evening light, consider implementing breathing exercises to target increasing attention capacity. Focus attention on breath- 3 counts in, 4 counts out. Set a timer and maintain attention as able, redirecting back to breath if your mind wanders  Avoid caffeine within 8 hours of bed time Dim your environment in the evening-- turn off lights you don't need, use lamps if able  

## 2022-01-20 NOTE — Addendum Note (Signed)
Addended by: Myra Gianotti L on: 01/20/2022 10:05 AM   Modules accepted: Orders

## 2022-01-21 ENCOUNTER — Ambulatory Visit
Admission: RE | Admit: 2022-01-21 | Discharge: 2022-01-21 | Disposition: A | Discharge status: Home / Self Care | Payer: BC Managed Care – PPO | Source: Ambulatory Visit | Attending: Neurology | Admitting: Neurology

## 2022-01-21 DIAGNOSIS — G35 Multiple sclerosis: Secondary | ICD-10-CM

## 2022-01-21 MED ORDER — GADOBENATE DIMEGLUMINE 529 MG/ML IV SOLN
17.0000 mL | Freq: Once | INTRAVENOUS | Status: AC | PRN
  Administered 2022-01-21: 17 mL via INTRAVENOUS

## 2022-01-23 ENCOUNTER — Encounter: Payer: BC Managed Care – PPO | Admitting: Speech Pathology

## 2022-01-30 ENCOUNTER — Other Ambulatory Visit: Payer: Self-pay

## 2022-01-30 ENCOUNTER — Encounter: Payer: Self-pay | Admitting: Neurology

## 2022-01-30 DIAGNOSIS — G35 Multiple sclerosis: Secondary | ICD-10-CM

## 2022-01-30 DIAGNOSIS — R413 Other amnesia: Secondary | ICD-10-CM

## 2022-01-30 DIAGNOSIS — G379 Demyelinating disease of central nervous system, unspecified: Secondary | ICD-10-CM

## 2022-01-30 MED ORDER — VITAMIN D (ERGOCALCIFEROL) 1.25 MG (50000 UNIT) PO CAPS: 50000.0000 [IU] | ORAL_CAPSULE | ORAL | 3 refills | Status: DC

## 2022-02-02 NOTE — Progress Notes (Unsigned)
NEUROLOGY FOLLOW UP OFFICE NOTE  Ivadell Gaul [id number]  Assessment/Plan:   Newly-diagnosed relapsing-remitting multiple sclerosis.   Plan to start Tysabri Continue D3 50,000 IU every 7 days Continue Physical therapy.  Ask that they provide [place] with a PT center at home to refer Repeat MRI of brain, cervical and thoracic spine with and without contrast in 6 months Repeat CBC with diff, CMP, JC virus antibody and index, and vit D in 6 months Follow up in 6 months after repeat testing.     Subjective:  Lizett Chowning is a 21 year old female with GAD, PTSD who follows up for multiple sclerosis.  MRI of neuro-axis performed in hospital personally reviewed.  She is accompanied by her boyfriend.  Her mom present via phone.    UPDATE: ***   HISTORY: In early March 2023, she began feeling off balance.  At first it ws believed to be side effects of eating edibles.  She was seen in the ED where labs were unremarkable and she was treated with IVF.  She was then started on fluoxetine.  She then started noticing that she was leaning towards the right and felt dizzy and lightheaded when turning her head or walking.  She would have associated blurred vision with these spells.  She had an MRI of the brain with and without contrast on 07/17/2021 which showed severe white matter disease without abnormal enhancement, including the bilateral cerebral hemispheres, right side of brainstem and right middle cerebellar peduncle, concerning for demyelinating disease.  On 3/27, she started experiencing numbness in the legs up to the abdomen and feels a tightness in her abdomen.  Notes weakness articularly in the right leg.  No appetite.  She wants to sleep all of the time.  Balance has progressively gotten worse.  Due to the significant worsening symptoms, she was advised to go to the hospital to be admitted for acute management of probable MS and completion of workup.  Repeat MRI of brain with and without  contrast again demonstrated extensive supratentorial and infratentorial white matter lesions but now with multiple enhancing lesions involving the cerebral hemispheres and brainstem indicating active demyelination.  MRI of cervical and thoracic spine with and without contrast showed extensive demyelinating lesions throughout the cervical and thoracic spinal cord with an enhancing lesion at the C6-7 level and probable subtle patchy enhancing lesions at levels consistent with active demyelination.  Blood work included negative HIV, negative I-stat hCG, TSH 1.605, B12 544, folate 14.7, negative iron panel, negative JCV PCR, negative Quantiferon-TB Gold, negative acute Hep B panel and vit D level of 26.08.  She received 5 day course of IV Solu-Medrol.  Developed bumpy rash on forehead afterwards.  Not sure if reaction to the steroids.    Imaging:  07/25/2021 MRI BRAIN W WO:  1. Extensive T2/FLAIR signal abnormality involving the supratentorial and infratentorial cerebral white matter, most concerning for demyelinating disease. Multiple scattered enhancing lesion involving the bilateral cerebral hemispheres and brainstem, consistent with active demyelination.  2. No other acute intracranial abnormality. 07/25/2021 MRI C-SPINE W WO:  1. Extensive patchy signal abnormality throughout the cervical spinal cord, consistent with demyelinating disease. Associated enhancement about a lesion at the level of C6-7 consistent with active demyelination. 2. Mild for age spinal stenosis at C3-4 through C6-7 without significant spinal stenosis. Associated mild left C6 and C7 foraminal narrowing. 07/25/2021 MRI T-SPINE W WO:  1. Extensive patchy signal abnormality throughout the majority of the thoracic spinal cord, consistent with demyelinating disease.  Probable subtle patchy enhancement about the lesions at the levels T7-8 and T8-9, consistent with active demyelination.  2. No significant degenerative disc disease within the  thoracic spine. No stenosis or neural impingement. 07/17/2021 MRI BRAIN W WO:  No acute infarct, mass effect or extra-axial collection. No acute or chronic hemorrhage. There is severe bilateral white matter disease with multiple hyperintense T2-weighted signal lesions within both hemispheres. There are lesions of the right brainstem and right middle cerebellar peduncle. The midline structures are normal. There is no abnormal contrast enhancement.  PAST MEDICAL HISTORY: No past medical history on file.  MEDICATIONS: Current Outpatient Medications on File Prior to Visit  Medication Sig Dispense Refill   ARIPiprazole (ABILIFY) 10 MG tablet Take 10 mg by mouth daily.     famotidine (PEPCID) 10 MG tablet Take 20 mg by mouth daily as needed for heartburn or indigestion. Pepcid Complete OTC     ferrous sulfate 300 (60 Fe) MG/5ML syrup Take 3.7 mLs (220 mg total) by mouth daily with breakfast. 150 mL 3   FLUoxetine (PROZAC) 10 MG capsule Take 10 mg by mouth at bedtime.     meclizine (ANTIVERT) 25 MG tablet Take 1 tablet (25 mg total) by mouth 3 (three) times daily as needed for dizziness. 30 tablet 0   natalizumab (TYSABRI) 300 MG/15ML injection Inject into the vein once.     Vitamin D, Ergocalciferol, (DRISDOL) 1.25 MG (50000 UNIT) CAPS capsule Take 1 capsule (50,000 Units total) by mouth every 7 (seven) days. 5 capsule 3   No current facility-administered medications on file prior to visit.    ALLERGIES: No Known Allergies  FAMILY HISTORY: No family history on file.    Objective:  *** General: No acute distress.  Patient appears well-groomed.   Head:  Normocephalic/atraumatic Eyes:  Fundi examined but not visualized Neck: supple, no paraspinal tenderness, full range of motion Heart:  Regular rate and rhythm Back: No paraspinal tenderness Neurological Exam: alert and oriented to person, place, and time.  Speech fluent and not dysarthric, language intact.  CN II-XII intact. Bulk and tone  normal, muscle strength 4/5 right EHL, otherwise 5/5 throughout.  Vibratory sensation reduced in toes.  Pinprick sensation intact.  Deep tendon reflexes 3+ throughout, toes downgoing.  Finger to nose with mild kinetic tremor bilaterally.  Gait:  Broad-based cautious gait.  Ambulating with cane.  Unable to tandem walk.  Timed 25 foot walk ***.  Romberg with mild sway.   Metta Clines, DO  CC: Rolanda Jay, MD

## 2022-02-04 ENCOUNTER — Other Ambulatory Visit (INDEPENDENT_AMBULATORY_CARE_PROVIDER_SITE_OTHER): Payer: BC Managed Care – PPO

## 2022-02-04 ENCOUNTER — Ambulatory Visit: Payer: BC Managed Care – PPO | Admitting: Neurology

## 2022-02-04 ENCOUNTER — Other Ambulatory Visit: Payer: Self-pay | Admitting: Pharmacy Technician

## 2022-02-04 ENCOUNTER — Encounter: Payer: Self-pay | Admitting: Neurology

## 2022-02-04 VITALS — BP 106/71 | HR 63 | Ht 66.0 in | Wt 195.4 lb

## 2022-02-04 DIAGNOSIS — G35 Multiple sclerosis: Secondary | ICD-10-CM | POA: Diagnosis not present

## 2022-02-04 LAB — COMPREHENSIVE METABOLIC PANEL
ALT: 12 U/L (ref 0–35)
AST: 15 U/L (ref 0–37)
Albumin: 4.4 g/dL (ref 3.5–5.2)
Alkaline Phosphatase: 61 U/L (ref 39–117)
BUN: 14 mg/dL (ref 6–23)
CO2: 29 mEq/L (ref 19–32)
Calcium: 9.4 mg/dL (ref 8.4–10.5)
Chloride: 102 mEq/L (ref 96–112)
Creatinine, Ser: 0.66 mg/dL (ref 0.40–1.20)
GFR: 123.43 mL/min (ref >60.00)
Glucose, Bld: 78 mg/dL (ref 70–99)
Potassium: 3.8 mEq/L (ref 3.5–5.1)
Sodium: 137 mEq/L (ref 135–145)
Total Bilirubin: 0.4 mg/dL (ref 0.2–1.2)
Total Protein: 7.7 g/dL (ref 6.0–8.3)

## 2022-02-04 LAB — CBC WITH DIFFERENTIAL/PLATELET
Basophils Absolute: 0.1 10*3/uL (ref 0.0–0.1)
Basophils Relative: 1.2 % (ref 0.0–3.0)
Eosinophils Absolute: 0.6 10*3/uL (ref 0.0–0.7)
Eosinophils Relative: 10.4 % — ABNORMAL HIGH (ref 0.0–5.0)
HCT: 34.5 % — ABNORMAL LOW (ref 36.0–46.0)
Hemoglobin: 11.3 g/dL — ABNORMAL LOW (ref 12.0–15.0)
Lymphocytes Relative: 47.6 % — ABNORMAL HIGH (ref 12.0–46.0)
Lymphs Abs: 2.8 10*3/uL (ref 0.7–4.0)
MCHC: 32.8 g/dL (ref 30.0–36.0)
MCV: 84 fl (ref 78.0–100.0)
Monocytes Absolute: 0.5 10*3/uL (ref 0.1–1.0)
Monocytes Relative: 8.4 % (ref 3.0–12.0)
Neutro Abs: 1.9 10*3/uL (ref 1.4–7.7)
Neutrophils Relative %: 32.4 % — ABNORMAL LOW (ref 43.0–77.0)
Platelets: 210 10*3/uL (ref 150.0–400.0)
RBC: 4.1 Mil/uL (ref 3.87–5.11)
RDW: 14.3 % (ref 11.5–15.5)
WBC: 5.9 10*3/uL (ref 4.0–10.5)

## 2022-02-04 LAB — VITAMIN D 25 HYDROXY (VIT D DEFICIENCY, FRACTURES): VITD: 73.27 ng/mL (ref 30.00–100.00)

## 2022-02-04 NOTE — Patient Instructions (Addendum)
Continue Tysabri every 4 weeks Continue D3 50,000 IU every 7 days Check CBC with diff, CMP, vit D and JC Virus antibody with index today and again in 6 months Follow up in 6 months

## 2022-02-05 ENCOUNTER — Ambulatory Visit: Payer: BC Managed Care – PPO | Admitting: Neurology

## 2022-02-11 ENCOUNTER — Encounter: Payer: Self-pay | Admitting: Neurology

## 2022-02-11 ENCOUNTER — Telehealth: Payer: Self-pay | Admitting: Neurology

## 2022-02-11 NOTE — Telephone Encounter (Signed)
Spoke to [place] at [facility] [employer], Form not received, Please re fax for.  Form Refaxed over to [phone]

## 2022-02-11 NOTE — Telephone Encounter (Signed)
[employer] called in wanting to see if we got the reauthorization paperwork for this pt's Tysabri?

## 2022-02-13 ENCOUNTER — Ambulatory Visit (INDEPENDENT_AMBULATORY_CARE_PROVIDER_SITE_OTHER): Payer: BC Managed Care – PPO

## 2022-02-13 ENCOUNTER — Other Ambulatory Visit: Payer: Self-pay

## 2022-02-13 VITALS — BP 98/65 | HR 79 | Temp 97.8°F | Resp 18 | Ht 66.0 in | Wt 196.0 lb

## 2022-02-13 DIAGNOSIS — G35 Multiple sclerosis: Secondary | ICD-10-CM | POA: Diagnosis not present

## 2022-02-13 MED ORDER — SODIUM CHLORIDE 0.9 % IV SOLN
300.0000 mg | Freq: Once | INTRAVENOUS | Status: AC
  Administered 2022-02-13: 300 mg via INTRAVENOUS
  Filled 2022-02-13: qty 15

## 2022-02-13 MED ORDER — LORATADINE 10 MG PO TABS
10.0000 mg | ORAL_TABLET | Freq: Once | ORAL | Status: AC
  Administered 2022-02-13: 10 mg via ORAL
  Filled 2022-02-13: qty 1

## 2022-02-13 MED ORDER — ACETAMINOPHEN 325 MG PO TABS
650.0000 mg | ORAL_TABLET | Freq: Once | ORAL | Status: AC
  Administered 2022-02-13: 650 mg via ORAL
  Filled 2022-02-13: qty 2

## 2022-02-13 MED ORDER — SODIUM CHLORIDE 0.9 % IV SOLN: INTRAVENOUS | Status: DC

## 2022-02-13 NOTE — Progress Notes (Signed)
Diagnosis: Multiple Sclerosis  Provider:  Marshell Garfinkel MD  Procedure: Infusion  IV Type: Peripheral, IV Location: L Hand  Tysabri (Natalizumab), Dose: 300 mg  Infusion Start Time: 0600  Infusion Stop Time: 4599  Post Infusion IV Care: Observation period completed and Peripheral IV Discontinued  Discharge: Condition: Good, Destination: Home . AVS provided to patient.   Performed by:  Cleophus Molt, RN

## 2022-02-17 LAB — STRATIFY JCV(TM) AB W/INDEX
JCV Antibody: POSITIVE — AB
JCV Index Value: 0.96

## 2022-02-17 NOTE — Progress Notes (Unsigned)
NEUROLOGY FOLLOW UP OFFICE NOTE  Elizabeth Tran [id number]  Assessment/Plan:   Multiple sclerosis, clinically improved.    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  We discussed the risks and benefits of remaining on Tysabri.  We discussed the low risk of contracting PML (0.1% risk in the first 2 years, which then increases to 0.6% risk).  We will also need to repeat MRI of brain with and without contrast every 6 months.  Therefore, as long as her index remains below 1.0 and repeat MRIs reveal no sign of PML, she will remain on Tysabri for 2 years (until October 2025), at which point, we will switch to a different disease modifying therapy.  She would like to remain on Tysabri. DMT:  Tysabri Continue D3 50,000 IU every 7 days Check MRI of brain with and without contrast in 6 months. CBC with diff, CMP, JC virus antibody and index, and vit D in 6 months Follow up in 6 months after repeat testing.  Total time spent reviewing labs and face to face with patient:  25 minutes.    Subjective:  Elizabeth Tran is a 21 year old female with GAD, PTSD who follows up for multiple sclerosis.  MRI of neuro-axis performed in hospital personally reviewed.  She is accompanied by her mother via face time and her boyfriend.     UPDATE: Current DMT:  Tysabri (started 08/21/2021) Other medications:  D3 50,000 IU every 7 days.   Labs from 02/04/2022 revealed CBC with WBC 5.9, HGB 11.3, HCT 34.5, PLT 210, ALC 2.8; CMP with Na 137, K 3.8, Cl 102, CO2 29, Glucose 78, BUN 14, Cr 0.66, t bili 0.4, ALP 61, AST 15, ALT 12; vit D 73.27, JC Virus antibody positive with index 0.96.   Feeling much better.   Vision:  no issues Motor:  no issues Sensory:  no issues Pain:  mild pain from time to time Gait:  improved.  Able to walk without difficulty Bowel/Bladder:  no issues Fatigue:  no issues Cognition:  no issues Mood:  depressed mood.     HISTORY: In early March 2023, she began feeling off  balance.  At first it ws believed to be side effects of eating edibles.  She was seen in the ED where labs were unremarkable and she was treated with IVF.  She was then started on fluoxetine.  She then started noticing that she was leaning towards the right and felt dizzy and lightheaded when turning her head or walking.  She would have associated blurred vision with these spells.  She had an MRI of the brain with and without contrast on 07/17/2021 which showed severe white matter disease without abnormal enhancement, including the bilateral cerebral hemispheres, right side of brainstem and right middle cerebellar peduncle, concerning for demyelinating disease.  On 3/27, she started experiencing numbness in the legs up to the abdomen and feels a tightness in her abdomen.  Notes weakness articularly in the right leg.  No appetite.  She wants to sleep all of the time.  Balance has progressively gotten worse.  Due to the significant worsening symptoms, she was advised to go to the hospital to be admitted for acute management of probable MS and completion of workup.  Repeat MRI of brain with and without contrast again demonstrated extensive supratentorial and infratentorial white matter lesions but now with multiple enhancing lesions involving the cerebral hemispheres and brainstem indicating active demyelination.  MRI of cervical and thoracic spine with  and without contrast showed extensive demyelinating lesions throughout the cervical and thoracic spinal cord with an enhancing lesion at the C6-7 level and probable subtle patchy enhancing lesions at levels consistent with active demyelination.  Blood work included negative HIV, negative I-stat hCG, TSH 1.605, B12 544, folate 14.7, negative iron panel, negative JCV PCR, negative Quantiferon-TB Gold, negative acute Hep B panel and vit D level of 26.08.  She received 5 day course of IV Solu-Medrol.  Developed bumpy rash on forehead afterwards.  Not sure if reaction to the  steroids.     Imaging:  01/23/2022 MRI BRAIN/C/T-SPINE W WO:  1. Redemonstrated extensive, patchy, and confluent T2/FLAIR hyperintense signal throughout the brain, consistent with the patient's known multiple sclerosis. No new lesions are seen. No abnormal enhancement to suggest active demyelination.  2. Redemonstrated patchy T2 hyperintense signal throughout the majority of the spinal cord, consistent with demyelinating disease.  No enhancing lesions to suggest active demyelination. No new lesions are seen in the cervical or thoracic spine.  3. No spinal canal stenosis or neural foraminal narrowing in the cervical or thoracic spine. 07/25/2021 MRI BRAIN W WO:  1. Extensive T2/FLAIR signal abnormality involving the supratentorial and infratentorial cerebral white matter, most concerning for demyelinating disease. Multiple scattered enhancing lesion involving the bilateral cerebral hemispheres and brainstem, consistent with active demyelination.  2. No other acute intracranial abnormality. 07/25/2021 MRI C-SPINE W WO:  1. Extensive patchy signal abnormality throughout the cervical spinal cord, consistent with demyelinating disease. Associated enhancement about a lesion at the level of C6-7 consistent with active demyelination. 2. Mild for age spinal stenosis at C3-4 through C6-7 without significant spinal stenosis. Associated mild left C6 and C7 foraminal narrowing. 07/25/2021 MRI T-SPINE W WO:  1. Extensive patchy signal abnormality throughout the majority of the thoracic spinal cord, consistent with demyelinating disease.  Probable subtle patchy enhancement about the lesions at the levels T7-8 and T8-9, consistent with active demyelination.  2. No significant degenerative disc disease within the thoracic spine. No stenosis or neural impingement. 07/17/2021 MRI BRAIN W WO:  No acute infarct, mass effect or extra-axial collection. No acute or chronic hemorrhage. There is severe bilateral white matter disease  with multiple hyperintense T2-weighted signal lesions within both hemispheres. There are lesions of the right brainstem and right middle cerebellar peduncle. The midline structures are normal. There is no abnormal contrast enhancement.  PAST MEDICAL HISTORY: Past Medical History:  Diagnosis Date   Anxiety     MEDICATIONS: Current Outpatient Medications on File Prior to Visit  Medication Sig Dispense Refill   famotidine (PEPCID) 10 MG tablet Take 20 mg by mouth daily as needed for heartburn or indigestion. Pepcid Complete OTC     ferrous sulfate 300 (60 Fe) MG/5ML syrup Take 3.7 mLs (220 mg total) by mouth daily with breakfast. 150 mL 3   FLUoxetine (PROZAC) 10 MG capsule Take 10 mg by mouth at bedtime.     hydrOXYzine (ATARAX) 25 MG tablet Take 25 mg by mouth 2 (two) times daily.     meclizine (ANTIVERT) 25 MG tablet Take 1 tablet (25 mg total) by mouth 3 (three) times daily as needed for dizziness. 30 tablet 0   natalizumab (TYSABRI) 300 MG/15ML injection Inject into the vein once.     Vitamin D, Ergocalciferol, (DRISDOL) 1.25 MG (50000 UNIT) CAPS capsule Take 1 capsule (50,000 Units total) by mouth every 7 (seven) days. 5 capsule 3   No current facility-administered medications on file prior to visit.  ALLERGIES: No Known Allergies  FAMILY HISTORY: No family history on file.    Objective:  Blood pressure 109/73, pulse 84, resp. rate 18, height 5\' 6"  (1.676 m), weight 198 lb (89.8 kg), SpO2 95 %. General: No acute distress.  Patient appears well-groomed.     , DO  CC: Shon Millet, MD

## 2022-02-18 ENCOUNTER — Encounter: Payer: Self-pay | Admitting: Neurology

## 2022-02-18 ENCOUNTER — Ambulatory Visit (INDEPENDENT_AMBULATORY_CARE_PROVIDER_SITE_OTHER): Payer: BC Managed Care – PPO | Admitting: Neurology

## 2022-02-18 VITALS — BP 109/73 | HR 84 | Resp 18 | Ht 66.0 in | Wt 198.0 lb

## 2022-02-18 DIAGNOSIS — G35 Multiple sclerosis: Secondary | ICD-10-CM | POA: Diagnosis not present

## 2022-02-18 NOTE — Patient Instructions (Signed)
Rarely, people who take Tysabri and are positive for the JC Virus may get a brain infection called PML (progressive multifocal leukoencephalopathy).  There is only 0.1% risk getting it in the first 2 years of using Tysabri.  After 2 years, the risk increases to 0.6% (which is still very low). Because you are now positive for the JCV virus, we will have to limit use to no more than 2 years.  We will have to repeat MRI of brain every 6 months as well as rechecking the JC Virus titer.  If it becomes higher than 1, I would likely switch you to another medication right away (likely Ocrevus)  Otherwise, no change in management.  Repeat labs and MRI of brain two weeks before follow up with me (your appointment in August 06, 2022).

## 2022-02-19 NOTE — Addendum Note (Signed)
Addended by: Reather Laurence on: 02/19/2022 08:47 AM   Modules accepted: Orders

## 2022-03-09 ENCOUNTER — Encounter: Payer: Self-pay | Admitting: Neurology

## 2022-03-09 ENCOUNTER — Other Ambulatory Visit: Payer: Self-pay

## 2022-03-09 DIAGNOSIS — R413 Other amnesia: Secondary | ICD-10-CM

## 2022-03-09 DIAGNOSIS — G379 Demyelinating disease of central nervous system, unspecified: Secondary | ICD-10-CM

## 2022-03-09 DIAGNOSIS — G35 Multiple sclerosis: Secondary | ICD-10-CM

## 2022-03-09 MED ORDER — VITAMIN D (ERGOCALCIFEROL) 1.25 MG (50000 UNIT) PO CAPS: 50000.0000 [IU] | ORAL_CAPSULE | ORAL | 3 refills | Status: DC

## 2022-03-09 NOTE — Telephone Encounter (Signed)
Per Patient she needs a refill Per Dr.Jaffe last note 02/18/22, Continue D3 50,000 IU every 7 days

## 2022-03-13 ENCOUNTER — Ambulatory Visit (INDEPENDENT_AMBULATORY_CARE_PROVIDER_SITE_OTHER): Payer: BC Managed Care – PPO

## 2022-03-13 VITALS — BP 100/67 | HR 88 | Temp 98.1°F | Resp 16 | Ht 66.0 in | Wt 200.0 lb

## 2022-03-13 DIAGNOSIS — G35 Multiple sclerosis: Secondary | ICD-10-CM

## 2022-03-13 MED ORDER — SODIUM CHLORIDE 0.9 % IV SOLN
300.0000 mg | Freq: Once | INTRAVENOUS | Status: AC
  Administered 2022-03-13: 300 mg via INTRAVENOUS
  Filled 2022-03-13: qty 15

## 2022-03-13 MED ORDER — ACETAMINOPHEN 160 MG/5ML PO SOLN
650.0000 mg | Freq: Once | ORAL | Status: AC
  Administered 2022-03-13: 650 mg via ORAL
  Filled 2022-03-13: qty 20.3

## 2022-03-13 MED ORDER — ACETAMINOPHEN 325 MG PO TABS: 650.0000 mg | ORAL_TABLET | Freq: Once | ORAL | Status: DC

## 2022-03-13 MED ORDER — LORATADINE 10 MG PO TABS
10.0000 mg | ORAL_TABLET | Freq: Once | ORAL | Status: AC
  Administered 2022-03-13: 10 mg via ORAL
  Filled 2022-03-13: qty 1

## 2022-03-13 MED ORDER — SODIUM CHLORIDE 0.9 % IV SOLN: INTRAVENOUS | Status: DC

## 2022-03-13 NOTE — Progress Notes (Signed)
Diagnosis: Multiple Sclerosis  Provider:  Chilton Greathouse MD  Procedure: Infusion  IV Type: Peripheral, IV Location: R Antecubital  Tysabri (Natalizumab), Dose: 300 mg  Infusion Start Time: 1024  Infusion Stop Time: 1126  Post Infusion IV Care: Observation period completed and Peripheral IV Discontinued  Discharge: Condition: Good, Destination: Home . AVS provided to patient.   Performed by:  Loney Hering, LPN

## 2022-03-30 ENCOUNTER — Encounter: Payer: Self-pay | Admitting: Neurology

## 2022-04-08 ENCOUNTER — Other Ambulatory Visit: Payer: Self-pay | Admitting: Neurology

## 2022-04-08 ENCOUNTER — Encounter: Payer: Self-pay | Admitting: Neurology

## 2022-04-08 DIAGNOSIS — R413 Other amnesia: Secondary | ICD-10-CM

## 2022-04-08 DIAGNOSIS — G35 Multiple sclerosis: Secondary | ICD-10-CM

## 2022-04-08 DIAGNOSIS — G379 Demyelinating disease of central nervous system, unspecified: Secondary | ICD-10-CM

## 2022-04-08 MED ORDER — VITAMIN D (ERGOCALCIFEROL) 1.25 MG (50000 UNIT) PO CAPS: 50000.0000 [IU] | ORAL_CAPSULE | ORAL | 5 refills | Status: DC

## 2022-04-24 ENCOUNTER — Ambulatory Visit (INDEPENDENT_AMBULATORY_CARE_PROVIDER_SITE_OTHER): Payer: BC Managed Care – PPO

## 2022-04-24 ENCOUNTER — Encounter: Payer: Self-pay | Admitting: Neurology

## 2022-04-24 VITALS — BP 103/63 | HR 61 | Temp 98.1°F | Resp 16 | Ht 66.0 in | Wt 199.2 lb

## 2022-04-24 DIAGNOSIS — G35 Multiple sclerosis: Secondary | ICD-10-CM | POA: Diagnosis not present

## 2022-04-24 MED ORDER — SODIUM CHLORIDE 0.9 % IV SOLN
300.0000 mg | Freq: Once | INTRAVENOUS | Status: AC
  Administered 2022-04-24: 300 mg via INTRAVENOUS
  Filled 2022-04-24: qty 15

## 2022-04-24 MED ORDER — LORATADINE 10 MG PO TABS
10.0000 mg | ORAL_TABLET | Freq: Once | ORAL | Status: AC
  Administered 2022-04-24: 10 mg via ORAL
  Filled 2022-04-24: qty 1

## 2022-04-24 MED ORDER — SODIUM CHLORIDE 0.9 % IV SOLN: INTRAVENOUS | Status: DC

## 2022-04-24 MED ORDER — ACETAMINOPHEN 325 MG PO TABS
650.0000 mg | ORAL_TABLET | Freq: Once | ORAL | Status: AC
  Administered 2022-04-24: 650 mg via ORAL
  Filled 2022-04-24: qty 2

## 2022-04-24 NOTE — Progress Notes (Signed)
Diagnosis: Multiple Sclerosis  Provider:  Chilton Greathouse MD  Procedure: Infusion  IV Type: Peripheral, IV Location: L Forearm  Tysabri (Natalizumab), Dose: 300 mg  Infusion Start Time: 1018  Infusion Stop Time: 1122  Post Infusion IV Care: Peripheral IV Discontinued  Discharge: Condition: Good, Destination: Home . AVS provided to patient.   Performed by:  Adriana Mccallum, RN

## 2022-05-22 ENCOUNTER — Encounter: Payer: Self-pay | Admitting: Neurology

## 2022-05-22 ENCOUNTER — Ambulatory Visit (INDEPENDENT_AMBULATORY_CARE_PROVIDER_SITE_OTHER): Payer: BC Managed Care – PPO | Admitting: *Deleted

## 2022-05-22 VITALS — BP 101/67 | HR 56 | Temp 97.8°F | Resp 18 | Ht 66.0 in | Wt 197.6 lb

## 2022-05-22 DIAGNOSIS — G35 Multiple sclerosis: Secondary | ICD-10-CM

## 2022-05-22 MED ORDER — SODIUM CHLORIDE 0.9 % IV SOLN: INTRAVENOUS | Status: DC

## 2022-05-22 MED ORDER — ACETAMINOPHEN 160 MG/5ML PO SOLN
650.0000 mg | Freq: Once | ORAL | Status: AC
  Administered 2022-05-22: 650 mg via ORAL

## 2022-05-22 MED ORDER — LORATADINE 10 MG PO TABS
10.0000 mg | ORAL_TABLET | Freq: Once | ORAL | Status: AC
  Administered 2022-05-22: 10 mg via ORAL
  Filled 2022-05-22: qty 1

## 2022-05-22 MED ORDER — SODIUM CHLORIDE 0.9 % IV SOLN
300.0000 mg | Freq: Once | INTRAVENOUS | Status: AC
  Administered 2022-05-22: 300 mg via INTRAVENOUS
  Filled 2022-05-22: qty 15

## 2022-05-22 MED ORDER — ACETAMINOPHEN 325 MG PO TABS
650.0000 mg | ORAL_TABLET | Freq: Once | ORAL | Status: DC
  Filled 2022-05-22: qty 2

## 2022-05-22 NOTE — Progress Notes (Signed)
Diagnosis: Multiple Sclerosis  Provider:  Marshell Garfinkel MD  Procedure: Infusion  IV Type: Peripheral, IV Location: R Antecubital  Tysabri (Natalizumab), Dose: 300 mg  Infusion Start Time: 1011  Infusion Stop Time: 4097  Post Infusion IV Care: Observation period completed and Peripheral IV Discontinued  Discharge: Condition: Good, Destination: Home . AVS provided to patient.   Performed by:  Baxter Hire, RN

## 2022-06-22 ENCOUNTER — Ambulatory Visit (INDEPENDENT_AMBULATORY_CARE_PROVIDER_SITE_OTHER): Payer: BC Managed Care – PPO

## 2022-06-22 VITALS — BP 116/75 | HR 72 | Temp 98.4°F | Resp 16 | Ht 66.0 in | Wt 203.6 lb

## 2022-06-22 DIAGNOSIS — G35 Multiple sclerosis: Secondary | ICD-10-CM

## 2022-06-22 MED ORDER — LORATADINE 10 MG PO TABS
10.0000 mg | ORAL_TABLET | Freq: Once | ORAL | Status: AC
  Administered 2022-06-22: 10 mg via ORAL
  Filled 2022-06-22: qty 1

## 2022-06-22 MED ORDER — ACETAMINOPHEN 160 MG/5ML PO SOLN: 650.0000 mg | Freq: Once | ORAL | Status: DC

## 2022-06-22 MED ORDER — ACETAMINOPHEN 325 MG PO TABS
650.0000 mg | ORAL_TABLET | Freq: Once | ORAL | Status: DC
  Filled 2022-06-22: qty 2

## 2022-06-22 MED ORDER — SODIUM CHLORIDE 0.9 % IV SOLN
300.0000 mg | Freq: Once | INTRAVENOUS | Status: AC
  Administered 2022-06-22: 300 mg via INTRAVENOUS
  Filled 2022-06-22: qty 15

## 2022-06-22 NOTE — Progress Notes (Signed)
Diagnosis: Multiple Sclerosis  Provider:  Marshell Garfinkel MD  Procedure: Infusion  IV Type: Peripheral, IV Location: R Forearm  Tysabri (Natalizumab), Dose: 300 mg  Infusion Start Time: [id number]  Infusion Stop Time: 1119  Post Infusion IV Care: Observation period completed and Peripheral IV Discontinued  Discharge: Condition: Good, Destination: Home . AVS Declined  Performed by:  Adelina Mings, LPN

## 2022-07-17 ENCOUNTER — Encounter: Payer: Self-pay | Admitting: Neurology

## 2022-07-17 ENCOUNTER — Other Ambulatory Visit: Payer: Self-pay | Admitting: Neurology

## 2022-07-17 DIAGNOSIS — R413 Other amnesia: Secondary | ICD-10-CM

## 2022-07-17 DIAGNOSIS — G35 Multiple sclerosis: Secondary | ICD-10-CM

## 2022-07-17 DIAGNOSIS — G379 Demyelinating disease of central nervous system, unspecified: Secondary | ICD-10-CM

## 2022-07-17 MED ORDER — VITAMIN D (ERGOCALCIFEROL) 1.25 MG (50000 UNIT) PO CAPS: 50000.0000 [IU] | ORAL_CAPSULE | ORAL | 5 refills | Status: DC

## 2022-07-24 ENCOUNTER — Other Ambulatory Visit: Payer: Self-pay | Admitting: Neurology

## 2022-07-24 ENCOUNTER — Ambulatory Visit (INDEPENDENT_AMBULATORY_CARE_PROVIDER_SITE_OTHER): Payer: BC Managed Care – PPO | Admitting: *Deleted

## 2022-07-24 VITALS — BP 103/68 | HR 62 | Temp 97.9°F | Resp 16 | Ht 66.0 in | Wt 208.0 lb

## 2022-07-24 DIAGNOSIS — G35 Multiple sclerosis: Secondary | ICD-10-CM

## 2022-07-24 MED ORDER — ACETAMINOPHEN 325 MG PO TABS: 650.0000 mg | ORAL_TABLET | Freq: Once | ORAL | Status: DC

## 2022-07-24 MED ORDER — ACETAMINOPHEN 160 MG/5ML PO SOLN
650.0000 mg | Freq: Once | ORAL | Status: AC
  Administered 2022-07-24: 650 mg via ORAL
  Filled 2022-07-24: qty 20.3

## 2022-07-24 MED ORDER — LORATADINE 10 MG PO TABS
10.0000 mg | ORAL_TABLET | Freq: Once | ORAL | Status: AC
  Administered 2022-07-24: 10 mg via ORAL
  Filled 2022-07-24: qty 1

## 2022-07-24 MED ORDER — SODIUM CHLORIDE 0.9 % IV SOLN
300.0000 mg | Freq: Once | INTRAVENOUS | Status: AC
  Administered 2022-07-24: 300 mg via INTRAVENOUS
  Filled 2022-07-24: qty 15

## 2022-07-24 NOTE — Progress Notes (Cosign Needed Addendum)
Diagnosis: Multiple Sclerosis  Provider:  Marshell Garfinkel MD  Procedure: Infusion  IV Type: PICC, IV Location: L Antecubital  Tysabri (Natalizumab), Dose: 300 mg  Infusion Start Time: 1004 am  Infusion Stop Time: [id number] am  Post Infusion IV Care: Observation period completed and Peripheral IV Discontinued  Discharge: Condition: Good, Destination: Home . AVS Provided and AVS Declined  Performed by:  Eugenia Mcalpine, LPN

## 2022-07-24 NOTE — Progress Notes (Deleted)
Diagnosis: Multiple Sclerosis  Provider:  Marshell Garfinkel MD  Procedure: Infusion  IV Type: PICC, IV Location: L Antecubital  Tysabri (Natalizumab), Dose: 300 mg  Infusion Start Time: 1004 am  Infusion Stop Time: [id number] am  Post Infusion IV Care: Observation period completed and Peripheral IV Discontinued  Discharge: Condition: Good, Destination: Home . AVS Provided and AVS Declined  Performed by:  Oren Beckmann, RN

## 2022-07-27 ENCOUNTER — Ambulatory Visit
Admission: RE | Admit: 2022-07-27 | Discharge: 2022-07-27 | Disposition: A | Discharge status: Home / Self Care | Payer: BC Managed Care – PPO | Source: Ambulatory Visit | Attending: Neurology | Admitting: Neurology

## 2022-07-27 DIAGNOSIS — G35 Multiple sclerosis: Secondary | ICD-10-CM

## 2022-07-27 MED ORDER — GADOPICLENOL 0.5 MMOL/ML IV SOLN
10.0000 mL | Freq: Once | INTRAVENOUS | Status: AC | PRN
  Administered 2022-07-27: 10 mL via INTRAVENOUS

## 2022-08-05 ENCOUNTER — Other Ambulatory Visit: Payer: Self-pay | Admitting: Pharmacy Technician

## 2022-08-06 ENCOUNTER — Ambulatory Visit: Payer: BC Managed Care – PPO | Admitting: Neurology

## 2022-08-10 ENCOUNTER — Telehealth: Payer: Self-pay | Admitting: Pharmacy Technician

## 2022-08-10 NOTE — Telephone Encounter (Signed)
Tysabri renewal:  Auth Submission: APPROVED Site of care: Site of care: [facility] Payer: [employer] Medication & CPT/J Code(s) submitted: Tysabri (Natalizumab) [id number] Route of submission (phone, fax, portal):  Phone # Fax # Auth type: Buy/Bill Units/visits requested 12 DOSES Reference number: [id number] Approval from: 08/11/22 to 08/11/23

## 2022-08-11 NOTE — Progress Notes (Unsigned)
NEUROLOGY FOLLOW UP OFFICE NOTE  Elizabeth Tran [id number]  Assessment/Plan:   Multiple sclerosis, clinically improved.     Discussed with patient.  Due to the elevated JC Virus index, I would like to change DMT. DMT:  Plan to switch from Tysabri to dimethyl fumerate Continue D3 50,000 IU every 7 days Check CBC with diff, CMP, JC virus antibody and index, and vit D Follow up in 6 months after repeat testing.     Subjective:  Elizabeth Tran is a 22 year old female with GAD, PTSD who follows up for multiple sclerosis.  MRI of neuro-axis performed in hospital personally reviewed. MRI of brain personally reviewed.   UPDATE: Current DMT:  Tysabri (started 08/21/2021) Other medications:  D3 50,000 IU every 7 days.   MRI of brain with and without contrast on 07/27/2022 personally reviewed was stable.     Stable Vision:  no issues Motor:  no issues Sensory:  no issues Pain:  mild pain from time to time Gait:  stable Bowel/Bladder:  no issues Fatigue:  no issues Cognition:  no issues Mood:  depressed mood.     HISTORY: In early March 2023, she began feeling off balance.  At first it ws believed to be side effects of eating edibles.  She was seen in the ED where labs were unremarkable and she was treated with IVF.  She was then started on fluoxetine.  She then started noticing that she was leaning towards the right and felt dizzy and lightheaded when turning her head or walking.  She would have associated blurred vision with these spells.  She had an MRI of the brain with and without contrast on 07/17/2021 which showed severe white matter disease without abnormal enhancement, including the bilateral cerebral hemispheres, right side of brainstem and right middle cerebellar peduncle, concerning for demyelinating disease.  On 3/27, she started experiencing numbness in the legs up to the abdomen and feels a tightness in her abdomen.  Notes weakness articularly in the right leg.  No  appetite.  She wants to sleep all of the time.  Balance has progressively gotten worse.  Due to the significant worsening symptoms, she was advised to go to the hospital to be admitted for acute management of probable MS and completion of workup.  Repeat MRI of brain with and without contrast again demonstrated extensive supratentorial and infratentorial white matter lesions but now with multiple enhancing lesions involving the cerebral hemispheres and brainstem indicating active demyelination.  MRI of cervical and thoracic spine with and without contrast showed extensive demyelinating lesions throughout the cervical and thoracic spinal cord with an enhancing lesion at the C6-7 level and probable subtle patchy enhancing lesions at levels consistent with active demyelination.  Blood work included negative HIV, negative I-stat hCG, TSH 1.605, B12 544, folate 14.7, negative iron panel, negative JCV PCR, negative Quantiferon-TB Gold, negative acute Hep B panel and vit D level of 26.08.  She received 5 day course of IV Solu-Medrol.  Developed bumpy rash on forehead afterwards.  Not sure if reaction to the steroids.     Imaging:  01/23/2022 MRI BRAIN/C/T-SPINE W WO:  1. Redemonstrated extensive, patchy, and confluent T2/FLAIR hyperintense signal throughout the brain, consistent with the patient's known multiple sclerosis. No new lesions are seen. No abnormal enhancement to suggest active demyelination.  2. Redemonstrated patchy T2 hyperintense signal throughout the majority of the spinal cord, consistent with demyelinating disease.  No enhancing lesions to suggest active demyelination. No new lesions are seen  in the cervical or thoracic spine.  3. No spinal canal stenosis or neural foraminal narrowing in the cervical or thoracic spine. 07/25/2021 MRI BRAIN W WO:  1. Extensive T2/FLAIR signal abnormality involving the supratentorial and infratentorial cerebral white matter, most concerning for demyelinating disease.  Multiple scattered enhancing lesion involving the bilateral cerebral hemispheres and brainstem, consistent with active demyelination.  2. No other acute intracranial abnormality. 07/25/2021 MRI C-SPINE W WO:  1. Extensive patchy signal abnormality throughout the cervical spinal cord, consistent with demyelinating disease. Associated enhancement about a lesion at the level of C6-7 consistent with active demyelination. 2. Mild for age spinal stenosis at C3-4 through C6-7 without significant spinal stenosis. Associated mild left C6 and C7 foraminal narrowing. 07/25/2021 MRI T-SPINE W WO:  1. Extensive patchy signal abnormality throughout the majority of the thoracic spinal cord, consistent with demyelinating disease.  Probable subtle patchy enhancement about the lesions at the levels T7-8 and T8-9, consistent with active demyelination.  2. No significant degenerative disc disease within the thoracic spine. No stenosis or neural impingement. 07/17/2021 MRI BRAIN W WO:  No acute infarct, mass effect or extra-axial collection. No acute or chronic hemorrhage. There is severe bilateral white matter disease with multiple hyperintense T2-weighted signal lesions within both hemispheres. There are lesions of the right brainstem and right middle cerebellar peduncle. The midline structures are normal. There is no abnormal contrast enhancement.  PAST MEDICAL HISTORY: Past Medical History:  Diagnosis Date   Anxiety     MEDICATIONS: Current Outpatient Medications on File Prior to Visit  Medication Sig Dispense Refill   famotidine (PEPCID) 10 MG tablet Take 20 mg by mouth daily as needed for heartburn or indigestion. Pepcid Complete OTC     ferrous sulfate 300 (60 Fe) MG/5ML syrup Take 3.7 mLs (220 mg total) by mouth daily with breakfast. 150 mL 3   FLUoxetine (PROZAC) 10 MG capsule Take 10 mg by mouth at bedtime.     hydrOXYzine (ATARAX) 25 MG tablet Take 25 mg by mouth 2 (two) times daily.     meclizine  (ANTIVERT) 25 MG tablet Take 1 tablet (25 mg total) by mouth 3 (three) times daily as needed for dizziness. 30 tablet 0   natalizumab (TYSABRI) 300 MG/15ML injection Inject into the vein once.     Vitamin D, Ergocalciferol, (DRISDOL) 1.25 MG (50000 UNIT) CAPS capsule Take 1 capsule (50,000 Units total) by mouth every 7 (seven) days. 5 capsule 5   No current facility-administered medications on file prior to visit.    ALLERGIES: No Known Allergies  FAMILY HISTORY: No family history on file.    Objective:  Blood pressure 113/64, pulse (!) 55, height 5\' 6"  (1.676 m), weight 210 lb (95.3 kg), SpO2 99 %. General: No acute distress.  Patient appears well-groomed.   Head:  Normocephalic/atraumatic Eyes:  Fundi examined but not visualized Neck: supple, no paraspinal tenderness, full range of motion Heart:  Regular rate and rhythm Lungs:  Clear to auscultation bilaterally Back: No paraspinal tenderness Neurological Exam: alert and oriented to person, place, and time.  Speech fluent and not dysarthric, language intact.  CN II-XII intact. Bulk and tone normal, muscle strength 5/5 throughout.  Sensation to light touch intact.  Deep tendon reflexes 2+ throughout, toes downgoing.  Finger to nose testing intact.  Gait normal, Romberg negative.   Shon Millet, DO  CC: Melchor Amour, MD

## 2022-08-12 ENCOUNTER — Encounter: Payer: Self-pay | Admitting: Neurology

## 2022-08-12 ENCOUNTER — Other Ambulatory Visit (INDEPENDENT_AMBULATORY_CARE_PROVIDER_SITE_OTHER): Payer: BC Managed Care – PPO

## 2022-08-12 ENCOUNTER — Ambulatory Visit (INDEPENDENT_AMBULATORY_CARE_PROVIDER_SITE_OTHER): Payer: BC Managed Care – PPO | Admitting: Neurology

## 2022-08-12 ENCOUNTER — Other Ambulatory Visit: Payer: Self-pay | Admitting: Pharmacy Technician

## 2022-08-12 VITALS — BP 113/64 | HR 55 | Ht 66.0 in | Wt 210.0 lb

## 2022-08-12 DIAGNOSIS — G35 Multiple sclerosis: Secondary | ICD-10-CM

## 2022-08-12 LAB — COMPREHENSIVE METABOLIC PANEL
ALT: 12 U/L (ref 0–35)
AST: 17 U/L (ref 0–37)
Albumin: 4.4 g/dL (ref 3.5–5.2)
Alkaline Phosphatase: 63 U/L (ref 39–117)
BUN: 13 mg/dL (ref 6–23)
CO2: 28 mEq/L (ref 19–32)
Calcium: 9.3 mg/dL (ref 8.4–10.5)
Chloride: 102 mEq/L (ref 96–112)
Creatinine, Ser: 0.71 mg/dL (ref 0.40–1.20)
GFR: 119.2 mL/min (ref >60.00)
Glucose, Bld: 81 mg/dL (ref 70–99)
Potassium: 3.8 mEq/L (ref 3.5–5.1)
Sodium: 139 mEq/L (ref 135–145)
Total Bilirubin: 0.4 mg/dL (ref 0.2–1.2)
Total Protein: 7.5 g/dL (ref 6.0–8.3)

## 2022-08-12 LAB — VITAMIN D 25 HYDROXY (VIT D DEFICIENCY, FRACTURES): VITD: 46.54 ng/mL (ref 30.00–100.00)

## 2022-08-12 NOTE — Patient Instructions (Addendum)
Due to elevated JC virus antibody titer, will stop Tysabri.  Plan to change to dimethyl fumerate.  Cancel next Tysabri Continue vitamin D3 50,000 IU weekly Check CBC with diff/platelet, CMP, JC virus antibody with index and vit D.  Repeat labs (except JC virus) in 6 months. Your provider has requested that you have labwork completed today. Please go to [facility] ([id number]) on the second floor of this building before leaving the office today. You do not need to check in. If you are not called within 15 minutes please check with the front desk.   Follow up 6 months (after repeat labs).

## 2022-08-13 LAB — CBC WITH DIFFERENTIAL
Basophils Absolute: 0 10*3/uL (ref 0.0–0.2)
Basos: 1 %
EOS (ABSOLUTE): 0.6 10*3/uL — ABNORMAL HIGH (ref 0.0–0.4)
Eos: 9 %
Hematocrit: 34.2 % (ref 34.0–46.6)
Hemoglobin: 11 g/dL — ABNORMAL LOW (ref 11.1–15.9)
Immature Grans (Abs): 0 10*3/uL (ref 0.0–0.1)
Lymphs: 39 %
RBC: 4.03 x10E6/uL (ref 3.77–5.28)
RDW: 14.6 % (ref 11.7–15.4)

## 2022-08-13 LAB — STRATIFY JCV(TM) AB W/INDEX

## 2022-08-14 ENCOUNTER — Encounter: Payer: Self-pay | Admitting: Neurology

## 2022-08-17 ENCOUNTER — Other Ambulatory Visit: Payer: Self-pay

## 2022-08-17 DIAGNOSIS — G35 Multiple sclerosis: Secondary | ICD-10-CM

## 2022-08-17 MED ORDER — DIMETHYL FUMARATE 240 MG PO CPDR
240.0000 mg | DELAYED_RELEASE_CAPSULE | Freq: Two times a day (BID) | ORAL | 11 refills | Status: DC
Start: 2022-08-17 — End: 2022-09-15

## 2022-08-17 MED ORDER — DIMETHYL FUMARATE 120 MG PO CPDR
DELAYED_RELEASE_CAPSULE | ORAL | 0 refills | Status: DC
Start: 2022-08-17 — End: 2022-11-24

## 2022-08-20 LAB — CBC WITH DIFFERENTIAL
Immature Granulocytes: 0 %
Lymphocytes Absolute: 2.7 10*3/uL (ref 0.7–3.1)
MCH: 27.3 pg (ref 26.6–33.0)
MCHC: 32.2 g/dL (ref 31.5–35.7)
MCV: 85 fL (ref 79–97)
Monocytes Absolute: 0.7 10*3/uL (ref 0.1–0.9)
Monocytes: 11 %
Neutrophils Absolute: 2.7 10*3/uL (ref 1.4–7.0)
Neutrophils: 40 %
WBC: 6.8 10*3/uL (ref 3.4–10.8)

## 2022-08-20 LAB — STRATIFY JCV(TM) AB W/INDEX

## 2022-08-21 ENCOUNTER — Ambulatory Visit: Payer: BC Managed Care – PPO

## 2022-08-27 ENCOUNTER — Telehealth: Payer: Self-pay

## 2022-08-27 ENCOUNTER — Other Ambulatory Visit (HOSPITAL_COMMUNITY): Payer: Self-pay

## 2022-08-27 NOTE — Telephone Encounter (Signed)
Patient Advocate Encounter   Received notification from [facility] [employer] that prior authorization is required for Dimethyl Fumarate 120MG  dr capsules   Submitted: 08-27-2022 Key [id number]  Status is pending

## 2022-08-27 NOTE — Telephone Encounter (Signed)
PA needed for Dimtheyl Fumerate

## 2022-09-03 ENCOUNTER — Encounter: Payer: Self-pay | Admitting: Neurology

## 2022-09-04 ENCOUNTER — Other Ambulatory Visit: Payer: Self-pay

## 2022-09-04 ENCOUNTER — Other Ambulatory Visit (HOSPITAL_COMMUNITY): Payer: Self-pay

## 2022-09-04 ENCOUNTER — Other Ambulatory Visit: Payer: Self-pay | Admitting: Neurology

## 2022-09-07 ENCOUNTER — Other Ambulatory Visit (HOSPITAL_COMMUNITY): Payer: Self-pay

## 2022-09-08 ENCOUNTER — Other Ambulatory Visit (HOSPITAL_COMMUNITY): Payer: Self-pay

## 2022-09-10 ENCOUNTER — Telehealth: Payer: Self-pay

## 2022-09-10 DIAGNOSIS — G35 Multiple sclerosis: Secondary | ICD-10-CM

## 2022-09-10 NOTE — Telephone Encounter (Signed)
PA needed for 240 mg

## 2022-09-13 ENCOUNTER — Other Ambulatory Visit (HOSPITAL_COMMUNITY): Payer: Self-pay

## 2022-09-13 NOTE — Telephone Encounter (Signed)
PA recently completed for 120mg . Test billing results with [facility] returns a $0 copay for the 240mg . No additional PA is needed.

## 2022-09-15 ENCOUNTER — Other Ambulatory Visit: Payer: Self-pay

## 2022-09-15 ENCOUNTER — Other Ambulatory Visit (HOSPITAL_COMMUNITY): Payer: Self-pay

## 2022-09-15 MED ORDER — DIMETHYL FUMARATE 240 MG PO CPDR
240.0000 mg | DELAYED_RELEASE_CAPSULE | Freq: Two times a day (BID) | ORAL | 11 refills | Status: DC
Start: 2022-09-15 — End: 2022-11-25
  Filled 2022-09-15 – 2022-09-22 (×2): qty 60, 30d supply, fill #0
  Filled 2022-10-13: qty 60, 30d supply, fill #1

## 2022-09-16 ENCOUNTER — Other Ambulatory Visit: Payer: Self-pay

## 2022-09-21 ENCOUNTER — Encounter: Payer: Self-pay | Admitting: Neurology

## 2022-09-22 ENCOUNTER — Other Ambulatory Visit: Payer: Self-pay | Admitting: Neurology

## 2022-09-22 ENCOUNTER — Other Ambulatory Visit: Payer: Self-pay

## 2022-09-22 ENCOUNTER — Other Ambulatory Visit (HOSPITAL_COMMUNITY): Payer: Self-pay

## 2022-09-22 DIAGNOSIS — G379 Demyelinating disease of central nervous system, unspecified: Secondary | ICD-10-CM

## 2022-09-22 DIAGNOSIS — R413 Other amnesia: Secondary | ICD-10-CM

## 2022-09-22 DIAGNOSIS — G35 Multiple sclerosis: Secondary | ICD-10-CM

## 2022-09-22 MED ORDER — VITAMIN D (ERGOCALCIFEROL) 1.25 MG (50000 UNIT) PO CAPS: 50000.0000 [IU] | ORAL_CAPSULE | ORAL | 5 refills | Status: DC

## 2022-09-23 ENCOUNTER — Other Ambulatory Visit (HOSPITAL_COMMUNITY): Payer: Self-pay

## 2022-10-05 NOTE — Telephone Encounter (Signed)
Patient Advocate Encounter  Prior Authorization for Dimethyl Fumarate 120MG  dr capsules has been approved through [employer].    Key: [id number]  Effective: 08-28-2022 to 08-27-2023

## 2022-10-13 ENCOUNTER — Other Ambulatory Visit (HOSPITAL_COMMUNITY): Payer: Self-pay

## 2022-10-16 ENCOUNTER — Other Ambulatory Visit (HOSPITAL_COMMUNITY): Payer: Self-pay

## 2022-10-22 ENCOUNTER — Other Ambulatory Visit: Payer: Self-pay

## 2022-10-23 ENCOUNTER — Encounter: Payer: Self-pay | Admitting: Neurology

## 2022-10-26 ENCOUNTER — Other Ambulatory Visit: Payer: Self-pay | Admitting: Neurology

## 2022-10-26 DIAGNOSIS — G379 Demyelinating disease of central nervous system, unspecified: Secondary | ICD-10-CM

## 2022-10-26 DIAGNOSIS — R413 Other amnesia: Secondary | ICD-10-CM

## 2022-10-26 DIAGNOSIS — G35 Multiple sclerosis: Secondary | ICD-10-CM

## 2022-10-26 MED ORDER — VITAMIN D (ERGOCALCIFEROL) 1.25 MG (50000 UNIT) PO CAPS: 50000.0000 [IU] | ORAL_CAPSULE | ORAL | 5 refills | Status: DC

## 2022-11-11 ENCOUNTER — Other Ambulatory Visit (HOSPITAL_COMMUNITY): Payer: Self-pay

## 2022-11-13 ENCOUNTER — Other Ambulatory Visit (HOSPITAL_COMMUNITY): Payer: Self-pay

## 2022-11-16 ENCOUNTER — Other Ambulatory Visit (HOSPITAL_COMMUNITY): Payer: Self-pay

## 2022-11-16 ENCOUNTER — Encounter: Payer: Self-pay | Admitting: Neurology

## 2022-11-18 ENCOUNTER — Other Ambulatory Visit (HOSPITAL_COMMUNITY): Payer: Self-pay

## 2022-11-23 NOTE — Progress Notes (Unsigned)
NEUROLOGY FOLLOW UP OFFICE NOTE  Demetri Lawrenson [id number]  Assessment/Plan:   Multiple sclerosis, status post flare up    DMT:  Plan to switch to Ocrevus Check CBC with diff, hepatitis panel, TB, vit D and immunoglobulin panel.  Repeat immunoglobulin panel and vit D in 6 months. Repeat MRI of brain/cervical/thoracic spine with and without contrast in 6 months. Physical therapy Follow up in 6 months (after repeat MRI and labs)     Subjective:  Altagracia Felgar is a 22 year old female with GAD, PTSD who follows up for multiple sclerosis.  History supplemented by her accompanying mother and hospital records.Marland Kitchen MRI of brain personally reviewed.   UPDATE: Current DMT:  Dimethyl fumarate 240mg  BID (started 4/24) Other medications:  D3 50,000 IU every 7 days.  Anella had an MS flare last week.  She presented to [facility] in [place] on 7/21 with weakness in legs, blurred vision in both eyes, crossed eyed, and dizzy.  MRI of brain with and without contrast showed several enhancing lesions consistent with active demyelination.  Received 5 day course of 1g Solu-Medrol daily.  Still with mild blurred vision and weakness in legs.  Using walker.  Dizziness resolved.  She has outpatient PT ordered.    HISTORY: In early March 2023, she began feeling off balance.  At first it ws believed to be side effects of eating edibles.  She was seen in the ED where labs were unremarkable and she was treated with IVF.  She was then started on fluoxetine.  She then started noticing that she was leaning towards the right and felt dizzy and lightheaded when turning her head or walking.  She would have associated blurred vision with these spells.  She had an MRI of the brain with and without contrast on 07/17/2021 which showed severe white matter disease without abnormal enhancement, including the bilateral cerebral hemispheres, right side of brainstem and right middle cerebellar peduncle,  concerning for demyelinating disease.  On 3/27, she started experiencing numbness in the legs up to the abdomen and feels a tightness in her abdomen.  Notes weakness articularly in the right leg.  No appetite.  She wants to sleep all of the time.  Balance has progressively gotten worse.  Due to the significant worsening symptoms, she was advised to go to the hospital to be admitted for acute management of probable MS and completion of workup.  Repeat MRI of brain with and without contrast again demonstrated extensive supratentorial and infratentorial white matter lesions but now with multiple enhancing lesions involving the cerebral hemispheres and brainstem indicating active demyelination.  MRI of cervical and thoracic spine with and without contrast showed extensive demyelinating lesions throughout the cervical and thoracic spinal cord with an enhancing lesion at the C6-7 level and probable subtle patchy enhancing lesions at levels consistent with active demyelination.  Blood work included negative HIV, negative I-stat hCG, TSH 1.605, B12 544, folate 14.7, negative iron panel, negative JCV PCR, negative Quantiferon-TB Gold, negative acute Hep B panel and vit D level of 26.08.  She received 5 day course of IV Solu-Medrol.  Developed bumpy rash on forehead afterwards.  Not sure if reaction to the steroids.    Past DMT:  Tysabri (4/23-4/24 positive JCV ab with high index)   Imaging:  11/16/2022 MRI BRAIN W WO:  1. Numerous demyelinating lesions predominantly in the supratentorial brain, several which demonstrate incomplete ring like enhancement. Findings are consistent with reported history of multiple sclerosis with enhancing  lesions likely representing active demyelination.  2. No acute infarction.  3. Short segment T2 hyperintense demyelinating lesion in the upper cervical spinal cord partly seen.  07/27/2022 MRI BRAIN W WO:  Unchanged white matter disease, compatible with known multiple sclerosis. No  definite new lesions. No abnormal enhancement to suggest acute demyelination. 01/23/2022 MRI BRAIN/C/T-SPINE W WO:  1. Redemonstrated extensive, patchy, and confluent T2/FLAIR hyperintense signal throughout the brain, consistent with the patient's known multiple sclerosis. No new lesions are seen. No abnormal enhancement to suggest active demyelination.  2. Redemonstrated patchy T2 hyperintense signal throughout the majority of the spinal cord, consistent with demyelinating disease.  No enhancing lesions to suggest active demyelination. No new lesions are seen in the cervical or thoracic spine.  3. No spinal canal stenosis or neural foraminal narrowing in the cervical or thoracic spine. 07/25/2021 MRI BRAIN W WO:  1. Extensive T2/FLAIR signal abnormality involving the supratentorial and infratentorial cerebral white matter, most concerning for demyelinating disease. Multiple scattered enhancing lesion involving the bilateral cerebral hemispheres and brainstem, consistent with active demyelination.  2. No other acute intracranial abnormality. 07/25/2021 MRI C-SPINE W WO:  1. Extensive patchy signal abnormality throughout the cervical spinal cord, consistent with demyelinating disease. Associated enhancement about a lesion at the level of C6-7 consistent with active demyelination. 2. Mild for age spinal stenosis at C3-4 through C6-7 without significant spinal stenosis. Associated mild left C6 and C7 foraminal narrowing. 07/25/2021 MRI T-SPINE W WO:  1. Extensive patchy signal abnormality throughout the majority of the thoracic spinal cord, consistent with demyelinating disease.  Probable subtle patchy enhancement about the lesions at the levels T7-8 and T8-9, consistent with active demyelination.  2. No significant degenerative disc disease within the thoracic spine. No stenosis or neural impingement. 07/17/2021 MRI BRAIN W WO:  No acute infarct, mass effect or extra-axial collection. No acute or chronic  hemorrhage. There is severe bilateral white matter disease with multiple hyperintense T2-weighted signal lesions within both hemispheres. There are lesions of the right brainstem and right middle cerebellar peduncle. The midline structures are normal. There is no abnormal contrast enhancement.  PAST MEDICAL HISTORY: Past Medical History:  Diagnosis Date   Anxiety     MEDICATIONS: Current Outpatient Medications on File Prior to Visit  Medication Sig Dispense Refill   Dimethyl Fumarate 120 MG CPDR Take 1 capsule (120 mg total) by mouth in the morning and at bedtime. For 7 days. LOADING DOSE 14 capsule 0   Dimethyl Fumarate 240 MG CPDR Take 1 capsule (240 mg total) by mouth 2 (two) times daily. 60 capsule 11   famotidine (PEPCID) 10 MG tablet Take 20 mg by mouth daily as needed for heartburn or indigestion. Pepcid Complete OTC     ferrous sulfate 300 (60 Fe) MG/5ML syrup Take 3.7 mLs (220 mg total) by mouth daily with breakfast. 150 mL 3   FLUoxetine (PROZAC) 10 MG capsule Take 10 mg by mouth at bedtime.     hydrOXYzine (ATARAX) 25 MG tablet Take 25 mg by mouth 2 (two) times daily.     meclizine (ANTIVERT) 25 MG tablet Take 1 tablet (25 mg total) by mouth 3 (three) times daily as needed for dizziness. 30 tablet 0   natalizumab (TYSABRI) 300 MG/15ML injection Inject into the vein once.     Vitamin D, Ergocalciferol, (DRISDOL) 1.25 MG (50000 UNIT) CAPS capsule Take 1 capsule (50,000 Units total) by mouth every 7 (seven) days. 5 capsule 5   No current facility-administered medications on file  prior to visit.    ALLERGIES: No Known Allergies  FAMILY HISTORY: No family history on file.    Objective:  Blood pressure 107/75, pulse 75, height 5\' 8"  (1.727 m), weight 221 lb (100.2 kg), SpO2 96%. General: No acute distress.  Patient appears well-groomed.   Head:  Normocephalic/atraumatic Eyes:  Fundi examined but not visualized Neck: supple, no paraspinal tenderness, full range of  motion Heart:  Regular rate and rhythm Neurological Exam: alert and oriented.  Speech fluent and not dysarthric, language intact.  CN II-XII intact. Bulk and tone normal, muscle strength 5/5 throughout.  Sensation to pinprick reduced in left upper and lower extremities.  Vibratory sensation and proprioception reduced in toes.  Deep tendon reflexes 2+ throughout.  Finger to nose testing with slight ataxia on the left.  Gait broad-based and cautious.  Romberg with sway.    Shon Millet, DO  CC: Melchor Amour, MD

## 2022-11-24 ENCOUNTER — Encounter: Payer: Self-pay | Admitting: Neurology

## 2022-11-24 ENCOUNTER — Ambulatory Visit (INDEPENDENT_AMBULATORY_CARE_PROVIDER_SITE_OTHER): Payer: BC Managed Care – PPO | Admitting: Neurology

## 2022-11-24 ENCOUNTER — Other Ambulatory Visit (INDEPENDENT_AMBULATORY_CARE_PROVIDER_SITE_OTHER): Payer: BC Managed Care – PPO

## 2022-11-24 VITALS — BP 107/75 | HR 75 | Ht 68.0 in | Wt 221.0 lb

## 2022-11-24 DIAGNOSIS — G35 Multiple sclerosis: Secondary | ICD-10-CM

## 2022-11-24 LAB — CBC WITH DIFFERENTIAL/PLATELET
Basophils Absolute: 0 10*3/uL (ref 0.0–0.1)
Basophils Relative: 0.2 % (ref 0.0–3.0)
Eosinophils Absolute: 0.3 10*3/uL (ref 0.0–0.7)
Eosinophils Relative: 5.1 % — ABNORMAL HIGH (ref 0.0–5.0)
HCT: 36.7 % (ref 36.0–46.0)
Hemoglobin: 11.7 g/dL — ABNORMAL LOW (ref 12.0–15.0)
Lymphocytes Relative: 29.5 % (ref 12.0–46.0)
Lymphs Abs: 1.7 10*3/uL (ref 0.7–4.0)
MCHC: 31.9 g/dL (ref 30.0–36.0)
MCV: 87.2 fl (ref 78.0–100.0)
Monocytes Absolute: 0.7 10*3/uL (ref 0.1–1.0)
Monocytes Relative: 11.8 % (ref 3.0–12.0)
Neutro Abs: 3.1 10*3/uL (ref 1.4–7.7)
Neutrophils Relative %: 53.4 % (ref 43.0–77.0)
Platelets: 259 10*3/uL (ref 150.0–400.0)
RBC: 4.21 Mil/uL (ref 3.87–5.11)
RDW: 16.1 % — ABNORMAL HIGH (ref 11.5–15.5)
WBC: 5.9 10*3/uL (ref 4.0–10.5)

## 2022-11-24 LAB — VITAMIN D 25 HYDROXY (VIT D DEFICIENCY, FRACTURES): VITD: 44.29 ng/mL (ref 30.00–100.00)

## 2022-11-24 NOTE — Patient Instructions (Signed)
Plan to change from dimethyl fumarate to Ocrevus IV infusion every 6 months Check CBC with diff, hepatitis panel, TB, vit D and immunoglobulin panel.  Repeat immunoglobulin panel and vit D in 6 months. Repeat MRI of brain/cervical/thoracic spine with and without contrast in 6 months. Physical therapy Follow up in 6 months (after repeat MRI and labs)

## 2022-11-25 ENCOUNTER — Encounter: Payer: Self-pay | Admitting: Neurology

## 2022-11-25 ENCOUNTER — Other Ambulatory Visit (HOSPITAL_COMMUNITY): Payer: Self-pay

## 2022-11-25 ENCOUNTER — Other Ambulatory Visit: Payer: Self-pay

## 2022-11-25 ENCOUNTER — Other Ambulatory Visit: Payer: Self-pay | Admitting: Neurology

## 2022-11-25 DIAGNOSIS — G35 Multiple sclerosis: Secondary | ICD-10-CM

## 2022-11-25 MED ORDER — DIMETHYL FUMARATE 240 MG PO CPDR
240.0000 mg | DELAYED_RELEASE_CAPSULE | Freq: Two times a day (BID) | ORAL | 5 refills | Status: DC
Start: 2022-11-25 — End: 2023-04-23
  Filled 2022-11-25: qty 60, 30d supply, fill #0
  Filled 2022-12-16: qty 60, 30d supply, fill #1

## 2022-11-26 ENCOUNTER — Other Ambulatory Visit (HOSPITAL_COMMUNITY): Payer: Self-pay

## 2022-11-26 ENCOUNTER — Other Ambulatory Visit: Payer: Self-pay

## 2022-12-01 ENCOUNTER — Telehealth: Payer: Self-pay

## 2022-12-01 NOTE — Telephone Encounter (Signed)
Advised patient referral was sent to the [facility] office but I did put patient would like to be seen at the [place] office closer to her home.   Per patient she was advised by her PCP office that her Hep panel wasn't done and that she will need to come back next Saturday to have it redrawn.

## 2022-12-08 ENCOUNTER — Telehealth: Payer: Self-pay

## 2022-12-08 NOTE — Telephone Encounter (Signed)
Referral resent  

## 2022-12-10 ENCOUNTER — Telehealth: Payer: Self-pay | Admitting: Neurology

## 2022-12-10 NOTE — Telephone Encounter (Signed)
Left message with the after hour service on 12-10-22 at12:54 pm   Caller is calling to request pt sight of treatment and shipment options

## 2022-12-11 NOTE — Telephone Encounter (Signed)
Leaving for Sheena per [facility]

## 2022-12-16 ENCOUNTER — Other Ambulatory Visit (HOSPITAL_COMMUNITY): Payer: Self-pay

## 2022-12-16 NOTE — Telephone Encounter (Signed)
Spoke to [employer], Referral received,Waiting on The process, Going through pharmacy team, If it needs a [employer]) Advised Beverely Risen is waiting on Site location.   Scharlene Gloss of location, Will need to know preference of shipment for medication.

## 2022-12-18 ENCOUNTER — Encounter: Payer: Self-pay | Admitting: Neurology

## 2022-12-21 ENCOUNTER — Encounter: Payer: Self-pay | Admitting: Neurology

## 2022-12-22 ENCOUNTER — Other Ambulatory Visit (HOSPITAL_COMMUNITY): Payer: Self-pay

## 2022-12-30 ENCOUNTER — Other Ambulatory Visit: Payer: Self-pay

## 2022-12-31 ENCOUNTER — Encounter: Payer: Self-pay | Admitting: Neurology

## 2023-01-01 ENCOUNTER — Other Ambulatory Visit (HOSPITAL_COMMUNITY): Payer: Self-pay

## 2023-01-01 ENCOUNTER — Telehealth: Payer: Self-pay

## 2023-01-01 ENCOUNTER — Telehealth: Payer: Self-pay | Admitting: Neurology

## 2023-01-01 ENCOUNTER — Other Ambulatory Visit: Payer: Self-pay | Admitting: Neurology

## 2023-01-01 NOTE — Telephone Encounter (Signed)
Auth Submission: APPROVED Site of care: Site of care: [facility] Payer: BCBS  Medication & CPT/J Code(s) submitted: Ocrevus [employer]) [phone] Route of submission (phone, fax, portal): Portal Phone # Fax # Auth type: Buy/Bill PB Units/visits requested: 300mg  x 2 doses plus 600mg  x 1 dose Reference number: [id number] Approval from: 12/31/22 to 12/31/23   I have sent a message to [employer] to sign her up for a copay card, medicaid on file is not active.

## 2023-01-01 NOTE — Telephone Encounter (Signed)
Patient has new insurance. Will need a new PA.   May not get approved due to the  [facility] PA is pending.     DR.Jaffe please advise

## 2023-01-01 NOTE — Telephone Encounter (Signed)
Mark from [facility] said the patients medication Dimethyl Fumarate 240MG  needs PA

## 2023-01-04 NOTE — Telephone Encounter (Signed)
Patient has been approved and scheduled to receive Ocrevus on 01/15/23.  Per patients request she needs to be seen on Fridays only. Scheduling will attempt to get patient scheduled sooner if patient agrees. Selena Batten

## 2023-01-04 NOTE — Telephone Encounter (Signed)
Kim are we able to see this patient STAT. Been without meds for a while.

## 2023-01-05 ENCOUNTER — Encounter: Payer: Self-pay | Admitting: Neurology

## 2023-01-06 ENCOUNTER — Other Ambulatory Visit: Payer: Self-pay | Admitting: Neurology

## 2023-01-06 ENCOUNTER — Telehealth: Payer: Self-pay | Admitting: Neurology

## 2023-01-06 ENCOUNTER — Telehealth: Payer: Self-pay | Admitting: Pharmacy Technician

## 2023-01-06 NOTE — Telephone Encounter (Signed)
Spoke to patient, she is scheduled for 01/12/23 at 9:15 am.

## 2023-01-06 NOTE — Telephone Encounter (Signed)
Spoke to patient on 01/05/23, Advised she needs to try and come to [facility] infusion sooner then the 20th of September. Too long without medication may cause some flares for her.    Per patient she has no way to come sooner.

## 2023-01-06 NOTE — Telephone Encounter (Addendum)
Auth Submission: APPROVED Site of care: Site of care: [facility] Payer: BCBS Medication & CPT/J Code(s) submitted: Emogene Morgan [employer]) [phone] Route of submission (phone, fax, portal):  Phone # Fax # Auth type: Buy/Bill PB Units/visits requested: 300MG  X2 DOSES, THEN Q6 MONTHS - 1200 UNITS Reference number: [id number] Approval from: 12/31/22 to 12/31/23   Co-pay card:  APPROVED ID: [id number] GR: [id number] RX BIN: [id number] PCN: [id number] PAYER ID: [id number] PHONE: [phone]

## 2023-01-06 NOTE — Telephone Encounter (Signed)
Tried calling patient in regards to her mychart, Hello I need to get in and do my infusion before next Friday the 20th I really need it like now my mobility is going away everyday. I need that infusion this Friday if possible. Thank you.    No answer LMOVM.

## 2023-01-06 NOTE — Telephone Encounter (Signed)
Patient Elizabeth Tran with AN she is returning a call to someone at the office

## 2023-01-06 NOTE — Telephone Encounter (Signed)
Pt called in returning Sheena's call 

## 2023-01-12 ENCOUNTER — Ambulatory Visit (INDEPENDENT_AMBULATORY_CARE_PROVIDER_SITE_OTHER): Payer: BC Managed Care – PPO

## 2023-01-12 VITALS — BP 113/76 | HR 58 | Temp 98.0°F | Resp 16 | Ht 66.0 in | Wt 211.2 lb

## 2023-01-12 DIAGNOSIS — G35 Multiple sclerosis: Secondary | ICD-10-CM | POA: Diagnosis not present

## 2023-01-12 MED ORDER — ACETAMINOPHEN 325 MG PO TABS
650.0000 mg | ORAL_TABLET | Freq: Once | ORAL | Status: AC
  Administered 2023-01-12: 650 mg via ORAL
  Filled 2023-01-12: qty 2

## 2023-01-12 MED ORDER — SODIUM CHLORIDE 0.9 % IV SOLN
300.0000 mg | Freq: Once | INTRAVENOUS | Status: AC
  Administered 2023-01-12: 300 mg via INTRAVENOUS
  Filled 2023-01-12: qty 10

## 2023-01-12 MED ORDER — METHYLPREDNISOLONE SODIUM SUCC 125 MG IJ SOLR
125.0000 mg | Freq: Once | INTRAMUSCULAR | Status: AC
  Administered 2023-01-12: 125 mg via INTRAVENOUS
  Filled 2023-01-12: qty 2

## 2023-01-12 MED ORDER — DIPHENHYDRAMINE HCL 25 MG PO CAPS
50.0000 mg | ORAL_CAPSULE | Freq: Once | ORAL | Status: AC
  Administered 2023-01-12: 50 mg via ORAL
  Filled 2023-01-12: qty 2

## 2023-01-12 NOTE — Progress Notes (Signed)
Diagnosis: Multiple Sclerosis  Provider:  Chilton Greathouse MD  Procedure: IV Infusion  IV Type: Peripheral, IV Location: R Hand  Ocrevus (Ocrelizumab), Dose: 300 mg  Infusion Start Time: 1138  Infusion Stop Time: 1430  Post Infusion IV Care: Observation period completed and Peripheral IV Discontinued  Discharge: Condition: Stable, Destination: Home . AVS Provided  Performed by:  Nat Math, RN

## 2023-01-15 ENCOUNTER — Ambulatory Visit: Payer: BC Managed Care – PPO

## 2023-01-18 ENCOUNTER — Telehealth: Payer: Self-pay

## 2023-01-18 ENCOUNTER — Encounter: Payer: Self-pay | Admitting: Neurology

## 2023-01-18 ENCOUNTER — Other Ambulatory Visit (HOSPITAL_COMMUNITY): Payer: Self-pay

## 2023-01-18 NOTE — Telephone Encounter (Signed)
Pharmacy Patient Advocate Encounter   Received notification from Patient Pharmacy that prior authorization for Dimethyl Fumarate 240MG  dr capsules is required/requested.   Insurance verification completed.   The patient is insured through [facility] .   Per test claim: PA required; PA started via CoverMyMeds. KEY [id number] . Waiting for clinical questions to populate.

## 2023-01-26 ENCOUNTER — Ambulatory Visit (INDEPENDENT_AMBULATORY_CARE_PROVIDER_SITE_OTHER): Payer: BC Managed Care – PPO

## 2023-01-26 VITALS — BP 105/71 | HR 74 | Temp 98.3°F | Resp 18 | Ht 66.0 in | Wt 207.4 lb

## 2023-01-26 DIAGNOSIS — G35 Multiple sclerosis: Secondary | ICD-10-CM

## 2023-01-26 MED ORDER — SODIUM CHLORIDE 0.9 % IV SOLN
300.0000 mg | Freq: Once | INTRAVENOUS | Status: AC
  Administered 2023-01-26: 300 mg via INTRAVENOUS
  Filled 2023-01-26: qty 10

## 2023-01-26 MED ORDER — METHYLPREDNISOLONE SODIUM SUCC 125 MG IJ SOLR
125.0000 mg | Freq: Once | INTRAMUSCULAR | Status: AC
  Administered 2023-01-26: 125 mg via INTRAVENOUS
  Filled 2023-01-26: qty 2

## 2023-01-26 MED ORDER — ACETAMINOPHEN 325 MG PO TABS
650.0000 mg | ORAL_TABLET | Freq: Once | ORAL | Status: AC
  Administered 2023-01-26: 650 mg via ORAL
  Filled 2023-01-26: qty 2

## 2023-01-26 MED ORDER — DIPHENHYDRAMINE HCL 25 MG PO CAPS
50.0000 mg | ORAL_CAPSULE | Freq: Once | ORAL | Status: AC
  Administered 2023-01-26: 50 mg via ORAL
  Filled 2023-01-26: qty 2

## 2023-01-26 NOTE — Progress Notes (Signed)
Diagnosis: Multiple Sclerosis  Provider:  Chilton Greathouse MD  Procedure: IV Infusion  IV Type: Peripheral, IV Location: L Hand  Ocrevus (Ocrelizumab), Dose: 300 mg  Infusion Start Time: 1125  Infusion Stop Time: 1420  Post Infusion IV Care: Observation period completed and Peripheral IV Discontinued  Discharge: Condition: Stable, Destination: Home . AVS Provided  Performed by:  Wyvonne Lenz, RN

## 2023-02-03 ENCOUNTER — Other Ambulatory Visit (HOSPITAL_COMMUNITY): Payer: Self-pay

## 2023-02-03 NOTE — Telephone Encounter (Signed)
Pharmacy Patient Advocate Encounter  Received notification from [facility] that Prior Authorization for Dimethyl Fumarate 240MG  dr capsules has been APPROVED from 02-03-2023 to 02-02-2024   PA #/Case ID/Reference #: [id number]  *Re-submitted

## 2023-02-08 ENCOUNTER — Encounter: Payer: Self-pay | Admitting: Neurology

## 2023-02-09 ENCOUNTER — Other Ambulatory Visit: Payer: Self-pay

## 2023-02-09 NOTE — Progress Notes (Unsigned)
Per DR.Jaffe, She gets repeat labs (CBC with diff, CMP, immunoglobulin panel, vit D) in 6 months (prior to next infusion).

## 2023-02-15 ENCOUNTER — Ambulatory Visit: Payer: BC Managed Care – PPO | Admitting: Neurology

## 2023-02-15 ENCOUNTER — Other Ambulatory Visit (INDEPENDENT_AMBULATORY_CARE_PROVIDER_SITE_OTHER): Payer: BC Managed Care – PPO

## 2023-02-15 DIAGNOSIS — G35 Multiple sclerosis: Secondary | ICD-10-CM | POA: Diagnosis not present

## 2023-02-15 LAB — COMPREHENSIVE METABOLIC PANEL
ALT: 11 U/L (ref 0–35)
AST: 14 U/L (ref 0–37)
Albumin: 4.3 g/dL (ref 3.5–5.2)
Alkaline Phosphatase: 59 U/L (ref 39–117)
BUN: 9 mg/dL (ref 6–23)
CO2: 27 meq/L (ref 19–32)
Calcium: 9.8 mg/dL (ref 8.4–10.5)
Chloride: 101 meq/L (ref 96–112)
Creatinine, Ser: 0.62 mg/dL (ref 0.40–1.20)
GFR: 124.4 mL/min (ref >60.00)
Glucose, Bld: 80 mg/dL (ref 70–99)
Potassium: 3.7 meq/L (ref 3.5–5.1)
Sodium: 137 meq/L (ref 135–145)
Total Bilirubin: 0.3 mg/dL (ref 0.2–1.2)
Total Protein: 7.8 g/dL (ref 6.0–8.3)

## 2023-02-24 ENCOUNTER — Telehealth: Payer: Self-pay | Admitting: Neurology

## 2023-02-24 NOTE — Telephone Encounter (Signed)
[facility] called and they just received a fax and it was missing the provider's signature.

## 2023-02-24 NOTE — Telephone Encounter (Signed)
Paperwork refaxed.

## 2023-02-26 ENCOUNTER — Encounter: Payer: Self-pay | Admitting: Neurology

## 2023-02-26 ENCOUNTER — Other Ambulatory Visit: Payer: Self-pay | Admitting: Neurology

## 2023-03-01 ENCOUNTER — Other Ambulatory Visit: Payer: Self-pay

## 2023-03-01 ENCOUNTER — Encounter: Payer: Self-pay | Admitting: Neurology

## 2023-03-01 DIAGNOSIS — G35 Multiple sclerosis: Secondary | ICD-10-CM

## 2023-03-01 NOTE — Progress Notes (Signed)
Per Dr.Jaffe, I would schedule for IV Solu-Medrol 1000mg  daily for 3 days.  At the same time, would order stat MRI of brain/cervical/thoracic spine with and without contrast

## 2023-03-02 ENCOUNTER — Telehealth: Payer: Self-pay

## 2023-03-02 ENCOUNTER — Telehealth: Payer: Self-pay | Admitting: Pharmacy Technician

## 2023-03-02 NOTE — Telephone Encounter (Signed)
Elizabeth Tran note: Patient will be scheduled as soon as possible.  Auth Submission: NO AUTH NEEDED Site of care: Site of care: [facility] Payer: BCBS Medication & CPT/J Code(s) submitted: Solumedrol (Methylprednisolone) J2930 Route of submission (phone, fax, portal):  Phone # Fax # Auth type: Buy/Bill PB Units/visits requested: 3 Reference number:  Approval from: 03/02/23 to 04/27/23

## 2023-03-02 NOTE — Telephone Encounter (Signed)
Advised patient if she is unable to come to [facility] for the Infusion or go through [place] she can go to the ED and do it there.  MRI was sent to [facility] due to the [facility] of them needing to be STAT.   Advised patient they may do the MRI in the ED if she asks.   Per patient she may try the ED for the Infusion near her.

## 2023-03-03 NOTE — Addendum Note (Signed)
Addended by: Desma Mcgregor on: 03/03/2023 02:47 PM   Modules accepted: Orders

## 2023-03-05 ENCOUNTER — Telehealth: Payer: Self-pay | Admitting: Neurology

## 2023-03-05 NOTE — Telephone Encounter (Signed)
Pt called in and left a message. She stated she is staying at the hospital for 5 days instead of 3. She also stated she is somewhat getting better.

## 2023-04-02 ENCOUNTER — Ambulatory Visit: Payer: BC Managed Care – PPO | Admitting: Neurology

## 2023-04-12 ENCOUNTER — Encounter: Payer: Self-pay | Admitting: Neurology

## 2023-04-20 NOTE — Progress Notes (Signed)
NEUROLOGY FOLLOW UP OFFICE NOTE  Elizabeth Tran [id number]  Assessment/Plan:   Relapsing-remitting multiple sclerosis    DMT:  Would continue Ocrevus for now as she had completed her first dose less than one month prior to flare up.   Continue vit D 50,000 international units once a week. Check vit D level today.   Cancel MRIs for next month as she just had them performed last month. Continue PT/OT. Discussed to consider short term disability for now and re-evaluate at follow ups Follow up in 6 months   Total time spent in chart and face to face with patient and her mother:  44 minutes    Subjective:  Elizabeth Tran is a 22 year old female with GAD, PTSD who follows up for multiple sclerosis.  History supplemented by her accompanying mother and hospital records. MRI of brain personally reviewed.   UPDATE: Current DMT:  Ocrevus (first dose 01/26/2023) Other medications:  D3 50,000 IU every 7 days.  Started first split dose Ocrevus on 01/12/2023.  There was a delay getting started due to difficulty scheduling locally in [place].  On 03/03/2023, she developed blurred vision, bilateral hand numbness, bilateral leg weakness and difficulty walking.  Admitted to [facility] and found to have MS flare with left optic neuritis.  Received 5 day course of Solu-Medrol.  She has been participating with outpatient PT.  Blurred vision is resolved.  Uses a cane to ambulate.  Feels like she is "leaning".  Denies numbness or weakness.  No change in bowel or bladder function.    Overall improved. Vision:  blurred vision resolved Motor:  denies weakness Sensory:  denies numbness and tingling Pain:  denies Gait:  Feels like she is leaning.  Feels unsteady on feet.  Uses cane. Bowel/Bladder:  No changes.   03/04/2023 MRI BRAIN W WO:  Worsening (compared to imaging from 11/16/2022) confluence of subcortical and deep white matter lesions involving both frontal lobes, the parietal lobes, and the  posterior limbs of the internal capsule consistent with progressive demyelination.  03/04/2023 MRI ORBITS W WO:  Abnormal enhancement and increased signal in the left optic nerve consistent with optic neuritis.  03/04/2023 MRI C & T-SPINE W WO:  Motion degraded study, limiting evaluation. There are numerous areas of abnormal signal of the cervical and thoracic spinal cord, most prominently involving the cervical spinal cord and upper thoracic spinal cord, consistent with areas of demyelination. There are areas of faint enhancement of the cervical spinal cord and upper thoracic spinal cord, most consistent with areas of active demyelination, including at the right aspect of C6-C7, and left aspect of T3. No severe canal or foraminal narrowing within the thoracic spine.   03/03/2023 LABS:  CBC with WBC 5.59, HGB 11.7,. HCT 35, PLT 238, ALC 1.50; CMP with Na 136, K 3.5, Cl 99, CO2 28, glucose 84, BUN 8, Cr 0.70, t bili 0.6, ALP 58, AST 14, ALT 10.    HISTORY: In early March 2023, she began feeling off balance.  At first it ws believed to be side effects of eating edibles.  She was seen in the ED where labs were unremarkable and she was treated with IVF.  She was then started on fluoxetine.  She then started noticing that she was leaning towards the right and felt dizzy and lightheaded when turning her head or walking.  She would have associated blurred vision with these spells.  She had an MRI of the brain with and without contrast on 07/17/2021  which showed severe white matter disease without abnormal enhancement, including the bilateral cerebral hemispheres, right side of brainstem and right middle cerebellar peduncle, concerning for demyelinating disease.  On 3/27, she started experiencing numbness in the legs up to the abdomen and feels a tightness in her abdomen.  Notes weakness articularly in the right leg.  No appetite.  She wants to sleep all of the time.  Balance has progressively gotten worse.  Due to  the significant worsening symptoms, she was advised to go to the hospital to be admitted for acute management of probable MS and completion of workup.  Repeat MRI of brain with and without contrast again demonstrated extensive supratentorial and infratentorial white matter lesions but now with multiple enhancing lesions involving the cerebral hemispheres and brainstem indicating active demyelination.  MRI of cervical and thoracic spine with and without contrast showed extensive demyelinating lesions throughout the cervical and thoracic spinal cord with an enhancing lesion at the C6-7 level and probable subtle patchy enhancing lesions at levels consistent with active demyelination.  Blood work included negative HIV, negative I-stat hCG, TSH 1.605, B12 544, folate 14.7, negative iron panel, negative JCV PCR, negative Quantiferon-TB Gold, negative acute Hep B panel and vit D level of 26.08.  She received 5 day course of IV Solu-Medrol.  Developed bumpy rash on forehead afterwards.  Not sure if reaction to the steroids.    She presented to [facility] in [place] on 11/15/2022 with MS flare presenting as weakness in legs, blurred vision in both eyes, crossed eyed, and dizzy.  MRI of brain with and without contrast showed several enhancing lesions consistent with active demyelination.  Received 5 day course of 1g Solu-Medrol daily.  Still with mild blurred vision and weakness in legs.  Past DMT:  Tysabri (4/23-4/24 positive JCV ab with high index), dimethyl fumarate (breakthrough flare)   Imaging:  11/16/2022 MRI BRAIN W WO:  1. Numerous demyelinating lesions predominantly in the supratentorial brain, several which demonstrate incomplete ring like enhancement. Findings are consistent with reported history of multiple sclerosis with enhancing lesions likely representing active demyelination.  2. No acute infarction.  3. Short segment T2 hyperintense demyelinating lesion in the upper cervical  spinal cord partly seen.  07/27/2022 MRI BRAIN W WO:  Unchanged white matter disease, compatible with known multiple sclerosis. No definite new lesions. No abnormal enhancement to suggest acute demyelination. 01/23/2022 MRI BRAIN/C/T-SPINE W WO:  1. Redemonstrated extensive, patchy, and confluent T2/FLAIR hyperintense signal throughout the brain, consistent with the patient's known multiple sclerosis. No new lesions are seen. No abnormal enhancement to suggest active demyelination.  2. Redemonstrated patchy T2 hyperintense signal throughout the majority of the spinal cord, consistent with demyelinating disease.  No enhancing lesions to suggest active demyelination. No new lesions are seen in the cervical or thoracic spine.  3. No spinal canal stenosis or neural foraminal narrowing in the cervical or thoracic spine. 07/25/2021 MRI BRAIN W WO:  1. Extensive T2/FLAIR signal abnormality involving the supratentorial and infratentorial cerebral white matter, most concerning for demyelinating disease. Multiple scattered enhancing lesion involving the bilateral cerebral hemispheres and brainstem, consistent with active demyelination.  2. No other acute intracranial abnormality. 07/25/2021 MRI C-SPINE W WO:  1. Extensive patchy signal abnormality throughout the cervical spinal cord, consistent with demyelinating disease. Associated enhancement about a lesion at the level of C6-7 consistent with active demyelination. 2. Mild for age spinal stenosis at C3-4 through C6-7 without significant spinal stenosis. Associated mild left C6 and C7  foraminal narrowing. 07/25/2021 MRI T-SPINE W WO:  1. Extensive patchy signal abnormality throughout the majority of the thoracic spinal cord, consistent with demyelinating disease.  Probable subtle patchy enhancement about the lesions at the levels T7-8 and T8-9, consistent with active demyelination.  2. No significant degenerative disc disease within the thoracic spine. No stenosis or  neural impingement. 07/17/2021 MRI BRAIN W WO:  No acute infarct, mass effect or extra-axial collection. No acute or chronic hemorrhage. There is severe bilateral white matter disease with multiple hyperintense T2-weighted signal lesions within both hemispheres. There are lesions of the right brainstem and right middle cerebellar peduncle. The midline structures are normal. There is no abnormal contrast enhancement.  PAST MEDICAL HISTORY: Past Medical History:  Diagnosis Date   Anxiety     MEDICATIONS: Current Outpatient Medications on File Prior to Visit  Medication Sig Dispense Refill   Dimethyl Fumarate 240 MG CPDR Take 1 capsule (240 mg total) by mouth 2 (two) times daily. 60 capsule 5   famotidine (PEPCID) 10 MG tablet Take 20 mg by mouth daily as needed for heartburn or indigestion. Pepcid Complete OTC     ferrous sulfate 300 (60 Fe) MG/5ML syrup Take 3.7 mLs (220 mg total) by mouth daily with breakfast. 150 mL 3   FLUoxetine (PROZAC) 10 MG capsule Take 10 mg by mouth at bedtime.     hydrOXYzine (ATARAX) 25 MG tablet Take 25 mg by mouth 2 (two) times daily.     meclizine (ANTIVERT) 25 MG tablet Take 1 tablet (25 mg total) by mouth 3 (three) times daily as needed for dizziness. 30 tablet 0   Vitamin D, Ergocalciferol, (DRISDOL) 1.25 MG (50000 UNIT) CAPS capsule Take 1 capsule (50,000 Units total) by mouth every 7 (seven) days. 5 capsule 5   No current facility-administered medications on file prior to visit.    ALLERGIES: No Known Allergies  FAMILY HISTORY: No family history on file.    Objective:  Blood pressure 111/70, pulse 68, height 5\' 6"  (1.676 m), weight 212 lb (96.2 kg), SpO2 100%. General: No acute distress.  Patient appears well-groomed.   Head:  Normocephalic/atraumatic Eyes:  Fundi examined but not visualized Neck: supple, no paraspinal tenderness, full range of motion Heart:  Regular rate and rhythm Neurological Exam: alert and oriented.  Speech fluent and not  dysarthric, language intact.  Saccadic eye movements on horizontal tracking bilaterally.  Otherwise, CN II-XII intact. Slight increased tone in legs, muscle strength 5-/5 deltoids, triceps and grip bilaterally.  Sensation to pinprick reduced in left upper and lower extremities.  Pinprick sensation intact.  Vibratory sensation reduced in toes.  Deep tendon reflexes 3+ throughout.  Nonsustained clonus in ankles.  Hoffman sign absent.  Babinski sign absent.  Finger to nose testing with slight ataxia on the left.  Gait broad-based and cautious.  Ambulates with cane.  Romberg with sway.    Shon Millet, DO  CC: Melchor Amour, MD

## 2023-04-23 ENCOUNTER — Encounter: Payer: Self-pay | Admitting: Neurology

## 2023-04-23 ENCOUNTER — Ambulatory Visit (INDEPENDENT_AMBULATORY_CARE_PROVIDER_SITE_OTHER): Payer: BC Managed Care – PPO | Admitting: Neurology

## 2023-04-23 ENCOUNTER — Other Ambulatory Visit: Payer: BC Managed Care – PPO

## 2023-04-23 VITALS — BP 111/70 | HR 68 | Ht 66.0 in | Wt 212.0 lb

## 2023-04-23 DIAGNOSIS — G35 Multiple sclerosis: Secondary | ICD-10-CM | POA: Diagnosis not present

## 2023-04-23 NOTE — Patient Instructions (Signed)
Continue Ocrevus Cancel MRIs Reschedule February appointment to 6 months from now Check vitamin D level

## 2023-04-24 LAB — VITAMIN D 25 HYDROXY (VIT D DEFICIENCY, FRACTURES): Vit D, 25-Hydroxy: 51 ng/mL (ref 30–100)

## 2023-04-26 ENCOUNTER — Encounter: Payer: Self-pay | Admitting: Neurology

## 2023-05-15 ENCOUNTER — Encounter: Payer: Self-pay | Admitting: Neurology

## 2023-05-27 ENCOUNTER — Other Ambulatory Visit: Payer: BC Managed Care – PPO

## 2023-05-30 ENCOUNTER — Other Ambulatory Visit: Payer: BC Managed Care – PPO

## 2023-06-01 ENCOUNTER — Ambulatory Visit: Payer: BC Managed Care – PPO | Admitting: Neurology

## 2023-07-21 ENCOUNTER — Encounter: Payer: Self-pay | Admitting: Neurology

## 2023-07-22 ENCOUNTER — Telehealth: Payer: Self-pay | Admitting: Pharmacy Technician

## 2023-07-22 NOTE — Telephone Encounter (Signed)
 Auth Submission: APPROVED NEW INSURANCE Site of care: Site of care: [facility] Payer: [employer] Medication & CPT/J Code(s) submitted: Emogene Morgan [employer] [employer]) [phone] Route of submission (phone, fax, portal): FAXED Phone # Fax # Auth type: Buy/Bill PB Units/visits requested: 600MG  Q6 MONTHS Reference number: [id number] Approval from: 07/21/23 to 3 /25/26

## 2023-07-27 ENCOUNTER — Ambulatory Visit (INDEPENDENT_AMBULATORY_CARE_PROVIDER_SITE_OTHER): Payer: BC Managed Care – PPO

## 2023-07-27 VITALS — BP 120/80 | HR 67 | Temp 98.1°F | Resp 18 | Ht 66.0 in | Wt 222.2 lb

## 2023-07-27 DIAGNOSIS — G35 Multiple sclerosis: Secondary | ICD-10-CM | POA: Diagnosis not present

## 2023-07-27 MED ORDER — DIPHENHYDRAMINE HCL 25 MG PO CAPS
50.0000 mg | ORAL_CAPSULE | Freq: Once | ORAL | Status: AC
  Administered 2023-07-27: 50 mg via ORAL
  Filled 2023-07-27: qty 2

## 2023-07-27 MED ORDER — SODIUM CHLORIDE 0.9 % IV SOLN
600.0000 mg | Freq: Once | INTRAVENOUS | Status: AC
  Administered 2023-07-27: 600 mg via INTRAVENOUS
  Filled 2023-07-27: qty 20

## 2023-07-27 MED ORDER — ACETAMINOPHEN 325 MG PO TABS
650.0000 mg | ORAL_TABLET | Freq: Once | ORAL | Status: AC
  Administered 2023-07-27: 650 mg via ORAL
  Filled 2023-07-27: qty 2

## 2023-07-27 MED ORDER — METHYLPREDNISOLONE SODIUM SUCC 125 MG IJ SOLR
125.0000 mg | Freq: Once | INTRAMUSCULAR | Status: AC
Start: 2023-07-27 — End: 2023-07-27
  Administered 2023-07-27: 125 mg via INTRAVENOUS
  Filled 2023-07-27: qty 2

## 2023-07-27 NOTE — Progress Notes (Signed)
 Diagnosis: Multiple Sclerosis  Provider:  Chilton Greathouse MD  Procedure: IV Infusion  IV Type: Peripheral, IV Location: L Forearm  Ocrevus (Ocrelizumab), Dose: 600 mg  Infusion Start Time: 1033  Infusion Stop Time: 1429  Post Infusion IV Care: Observation period completed and Peripheral IV Discontinued  Discharge: Condition: Good, Destination: Home . AVS Provided  Performed by:  Loney Hering, LPN

## 2023-10-21 NOTE — Progress Notes (Signed)
 NEUROLOGY FOLLOW UP OFFICE NOTE  Micki Cassel [id number]  Assessment/Plan:   Relapsing-remitting multiple sclerosis    DMT:  Ocrevus . Continue vit D 50,000 international units once a week.  Check MRI of brain/cervical/thoracic spine with and without contrast in 6 months. Check CBC with diff, LFTs, IgG and vit D today and repeat in 6 months. Follow up in 6 months (about 2 weeks after MRIs and repeat labs)       Subjective:  Henreitta Spittler is a 23 year old female with GAD, PTSD and Bipolar 1 disorder who follows up for multiple sclerosis.  History supplemented by her accompanying mother.   UPDATE: Current DMT:  Ocrevus  (first dose 01/26/2023.  Most recent infusion April 2025) Other medications:  D3 50,000 IU every 7 days.   Overall improved. Vision: When she is reading, her eyes start to shake.  She mostly feels it but the words look like they are jumping a little bit.  This has been happening for the past month.  No eye pain.  Will be going to the eye doctor next week.   Motor:  denies weakness Sensory:  denies numbness and tingling Pain:  Pain in the knees, sometimes in the thighs. Gait:  Still feels like she is leaning.  Can't bend down because it hurt her knees.  Not using the cane.   Bowel/Bladder:  No changes.  04/23/2023 LABS:  vit D 51    HISTORY: In early March 2023, she began feeling off balance.  At first it ws believed to be side effects of eating edibles.  She was seen in the ED where labs were unremarkable and she was treated with IVF.  She was then started on fluoxetine .  She then started noticing that she was leaning towards the right and felt dizzy and lightheaded when turning her head or walking.  She would have associated blurred vision with these spells.  She had an MRI of the brain with and without contrast on 07/17/2021 which showed severe white matter disease without abnormal enhancement, including the bilateral cerebral hemispheres, right side of  brainstem and right middle cerebellar peduncle, concerning for demyelinating disease.  On 3/27, she started experiencing numbness in the legs up to the abdomen and feels a tightness in her abdomen.  Notes weakness articularly in the right leg.  No appetite.  She wants to sleep all of the time.  Balance has progressively gotten worse.  Due to the significant worsening symptoms, she was advised to go to the hospital to be admitted for acute management of probable MS and completion of workup.  Repeat MRI of brain with and without contrast again demonstrated extensive supratentorial and infratentorial white matter lesions but now with multiple enhancing lesions involving the cerebral hemispheres and brainstem indicating active demyelination.  MRI of cervical and thoracic spine with and without contrast showed extensive demyelinating lesions throughout the cervical and thoracic spinal cord with an enhancing lesion at the C6-7 level and probable subtle patchy enhancing lesions at levels consistent with active demyelination.  Blood work included negative HIV, negative I-stat hCG, TSH 1.605, B12 544, folate 14.7, negative iron panel, negative JCV PCR, negative Quantiferon-TB Gold, negative acute Hep B panel and vit D level of 26.08.  She received 5 day course of IV Solu-Medrol .  Developed bumpy rash on forehead afterwards.  Not sure if reaction to the steroids.    She presented to [facility] in [place] on 11/15/2022 with MS flare presenting as weakness in legs,  blurred vision in both eyes, crossed eyed, and dizzy.  MRI of brain with and without contrast showed several enhancing lesions consistent with active demyelination.  Received 5 day course of 1g Solu-Medrol  daily.  Still with mild blurred vision and weakness in legs.  Started first split dose Ocrevus  on 01/12/2023.  There was a delay getting started due to difficulty scheduling locally in [place].  On 03/03/2023, she developed blurred vision,  bilateral hand numbness, bilateral leg weakness and difficulty walking.  Admitted to [facility] and found to have MS flare with left optic neuritis.  Received 5 day course of Solu-Medrol .  She has been participating with outpatient PT.  Blurred vision is resolved.  Uses a cane to ambulate.  Feels like she is leaning.  Past DMT:  Tysabri  (4/23-4/24 positive JCV ab with high index), dimethyl fumarate  (breakthrough flare)   Imaging:  03/04/2023 MRI BRAIN W WO:  Worsening (compared to imaging from 11/16/2022) confluence of subcortical and deep white matter lesions involving both frontal lobes, the parietal lobes, and the posterior limbs of the internal capsule consistent with progressive demyelination.  03/04/2023 MRI ORBITS W WO:  Abnormal enhancement and increased signal in the left optic nerve consistent with optic neuritis.  03/04/2023 MRI C & T-SPINE W WO:  Motion degraded study, limiting evaluation. There are numerous areas of abnormal signal of the cervical and thoracic spinal cord, most prominently involving the cervical spinal cord and upper thoracic spinal cord, consistent with areas of demyelination. There are areas of faint enhancement of the cervical spinal cord and upper thoracic spinal cord, most consistent with areas of active demyelination, including at the right aspect of C6-C7, and left aspect of T3. No severe canal or foraminal narrowing within the thoracic spine.  11/16/2022 MRI BRAIN W WO:  1. Numerous demyelinating lesions predominantly in the supratentorial brain, several which demonstrate incomplete ring like enhancement. Findings are consistent with reported history of multiple sclerosis with enhancing lesions likely representing active demyelination.  2. No acute infarction.  3. Short segment T2 hyperintense demyelinating lesion in the upper cervical spinal cord partly seen.  07/27/2022 MRI BRAIN W WO:  Unchanged white matter disease, compatible with known multiple sclerosis. No  definite new lesions. No abnormal enhancement to suggest acute demyelination. 01/23/2022 MRI BRAIN/C/T-SPINE W WO:  1. Redemonstrated extensive, patchy, and confluent T2/FLAIR hyperintense signal throughout the brain, consistent with the patient's known multiple sclerosis. No new lesions are seen. No abnormal enhancement to suggest active demyelination.  2. Redemonstrated patchy T2 hyperintense signal throughout the majority of the spinal cord, consistent with demyelinating disease.  No enhancing lesions to suggest active demyelination. No new lesions are seen in the cervical or thoracic spine.  3. No spinal canal stenosis or neural foraminal narrowing in the cervical or thoracic spine. 07/25/2021 MRI BRAIN W WO:  1. Extensive T2/FLAIR signal abnormality involving the supratentorial and infratentorial cerebral white matter, most concerning for demyelinating disease. Multiple scattered enhancing lesion involving the bilateral cerebral hemispheres and brainstem, consistent with active demyelination.  2. No other acute intracranial abnormality. 07/25/2021 MRI C-SPINE W WO:  1. Extensive patchy signal abnormality throughout the cervical spinal cord, consistent with demyelinating disease. Associated enhancement about a lesion at the level of C6-7 consistent with active demyelination. 2. Mild for age spinal stenosis at C3-4 through C6-7 without significant spinal stenosis. Associated mild left C6 and C7 foraminal narrowing. 07/25/2021 MRI T-SPINE W WO:  1. Extensive patchy signal abnormality throughout the majority of the thoracic spinal cord, consistent  with demyelinating disease.  Probable subtle patchy enhancement about the lesions at the levels T7-8 and T8-9, consistent with active demyelination.  2. No significant degenerative disc disease within the thoracic spine. No stenosis or neural impingement. 07/17/2021 MRI BRAIN W WO:  No acute infarct, mass effect or extra-axial collection. No acute or chronic  hemorrhage. There is severe bilateral white matter disease with multiple hyperintense T2-weighted signal lesions within both hemispheres. There are lesions of the right brainstem and right middle cerebellar peduncle. The midline structures are normal. There is no abnormal contrast enhancement.  PAST MEDICAL HISTORY: Past Medical History:  Diagnosis Date   Anxiety    MS (multiple sclerosis) (HCC)     MEDICATIONS: Current Outpatient Medications on File Prior to Visit  Medication Sig Dispense Refill   famotidine (PEPCID) 10 MG tablet Take 20 mg by mouth daily as needed for heartburn or indigestion. Pepcid Complete OTC     ferrous sulfate  300 (60 Fe) MG/5ML syrup Take 3.7 mLs (220 mg total) by mouth daily with breakfast. 150 mL 3   FLUoxetine  (PROZAC ) 10 MG capsule Take 10 mg by mouth at bedtime.     hydrOXYzine  (ATARAX ) 25 MG tablet Take 25 mg by mouth 2 (two) times daily.     meclizine  (ANTIVERT ) 25 MG tablet Take 1 tablet (25 mg total) by mouth 3 (three) times daily as needed for dizziness. 30 tablet 0   Vitamin D , Ergocalciferol , (DRISDOL ) 1.25 MG (50000 UNIT) CAPS capsule Take 1 capsule (50,000 Units total) by mouth every 7 (seven) days. 5 capsule 5   No current facility-administered medications on file prior to visit.    ALLERGIES: No Known Allergies  FAMILY HISTORY: No family history on file.    Objective:  Blood pressure 106/72, pulse 66, height 5' 6 (1.676 m), weight 226 lb (102.5 kg), SpO2 99%. General: No acute distress.  Flat affect.  Patient appears well-groomed.   Head:  Normocephalic/atraumatic Eyes:  Fundi examined but not visualized Neck: supple, no paraspinal tenderness, full range of motion Heart:  Regular rate and rhythm Neurological Exam: Alert and oriented.  Speech fluent and not dysarthric.  Language intact.  Saccadic eye movements on horizontal tracking bilaterally.  Otherwise, CN II-XII intact.  Slight increased tone in legs.  Muscle strength 5-/5 triceps,  left grip.  Otherwise, 5/5.  Sensation to pinprick intact.  Vibratory sensation reduced in toes bilaterally.  Deep tendon reflexes 3+ throughout.  Hoffman sign absent.  Babinski sign absent.  Finger to nose testing intact.  Gait broad-based and cautious with reduced right arm swing.  Romberg with mild sway.    Juliene Dunnings, DO  CC: Lamar Manns, MD

## 2023-10-22 ENCOUNTER — Other Ambulatory Visit

## 2023-10-22 ENCOUNTER — Encounter: Payer: Self-pay | Admitting: Neurology

## 2023-10-22 ENCOUNTER — Ambulatory Visit (INDEPENDENT_AMBULATORY_CARE_PROVIDER_SITE_OTHER): Payer: BC Managed Care – PPO | Admitting: Neurology

## 2023-10-22 VITALS — BP 106/72 | HR 66 | Ht 66.0 in | Wt 226.0 lb

## 2023-10-22 DIAGNOSIS — E559 Vitamin D deficiency, unspecified: Secondary | ICD-10-CM | POA: Diagnosis not present

## 2023-10-22 DIAGNOSIS — R413 Other amnesia: Secondary | ICD-10-CM | POA: Diagnosis not present

## 2023-10-22 DIAGNOSIS — G35 Multiple sclerosis: Secondary | ICD-10-CM | POA: Diagnosis not present

## 2023-10-22 DIAGNOSIS — G379 Demyelinating disease of central nervous system, unspecified: Secondary | ICD-10-CM

## 2023-10-22 MED ORDER — VITAMIN D (ERGOCALCIFEROL) 1.25 MG (50000 UNIT) PO CAPS: 50000.0000 [IU] | ORAL_CAPSULE | ORAL | 5 refills | Status: AC

## 2023-10-22 NOTE — Patient Instructions (Addendum)
 Ocrevus  every 6 months D3 50,000 units every 7 days Check labs CBC with diff, LFTs, vit D and IgG today and again in 6 months MRI of brain, cervical and thoracic spine with and without contrast in 6 months Follow up in 6 months (after repeat tests, about 2 weeks after MRIs)

## 2023-10-23 ENCOUNTER — Ambulatory Visit: Payer: Self-pay | Admitting: Neurology

## 2023-10-23 LAB — CBC WITH DIFFERENTIAL/PLATELET
Absolute Lymphocytes: 1398 {cells}/uL (ref 850–3900)
Absolute Monocytes: 499 {cells}/uL (ref 200–950)
Basophils Absolute: 30 {cells}/uL (ref 0–200)
Basophils Relative: 0.7 %
Eosinophils Absolute: 318 {cells}/uL (ref 15–500)
Eosinophils Relative: 7.4 %
HCT: 35.4 % (ref 35.0–45.0)
Hemoglobin: 11.5 g/dL — ABNORMAL LOW (ref 11.7–15.5)
MCH: 29.1 pg (ref 27.0–33.0)
MCHC: 32.5 g/dL (ref 32.0–36.0)
MCV: 89.6 fL (ref 80.0–100.0)
MPV: 9.9 fL (ref 7.5–12.5)
Monocytes Relative: 11.6 %
Neutro Abs: 2055 {cells}/uL (ref 1500–7800)
Neutrophils Relative %: 47.8 %
Platelets: 232 10*3/uL (ref 140–400)
RBC: 3.95 10*6/uL (ref 3.80–5.10)
RDW: 13.4 % (ref 11.0–15.0)
Total Lymphocyte: 32.5 %
WBC: 4.3 10*3/uL (ref 3.8–10.8)

## 2023-10-23 LAB — HEPATIC FUNCTION PANEL
AG Ratio: 1.6 (calc) (ref 1.0–2.5)
ALT: 12 U/L (ref 6–29)
AST: 16 U/L (ref 10–30)
Albumin: 4.5 g/dL (ref 3.6–5.1)
Alkaline phosphatase (APISO): 62 U/L (ref 31–125)
Bilirubin, Direct: 0.1 mg/dL (ref 0.0–0.2)
Globulin: 2.9 g/dL (ref 1.9–3.7)
Indirect Bilirubin: 0.3 mg/dL (ref 0.2–1.2)
Total Bilirubin: 0.4 mg/dL (ref 0.2–1.2)
Total Protein: 7.4 g/dL (ref 6.1–8.1)

## 2023-10-23 LAB — VITAMIN D 25 HYDROXY (VIT D DEFICIENCY, FRACTURES): Vit D, 25-Hydroxy: 93 ng/mL (ref 30–100)

## 2023-10-23 LAB — IGG: IgG (Immunoglobin G), Serum: 1379 mg/dL (ref 600–1640)

## 2023-10-25 NOTE — Progress Notes (Signed)
 Patient advised.

## 2023-11-10 ENCOUNTER — Encounter: Payer: Self-pay | Admitting: Neurology

## 2024-01-26 ENCOUNTER — Ambulatory Visit

## 2024-01-26 VITALS — BP 97/63 | HR 91 | Temp 98.6°F | Resp 18 | Ht 66.0 in | Wt 228.2 lb

## 2024-01-26 DIAGNOSIS — G35D Multiple sclerosis, unspecified: Secondary | ICD-10-CM | POA: Diagnosis not present

## 2024-01-26 MED ORDER — ACETAMINOPHEN 325 MG PO TABS
650.0000 mg | ORAL_TABLET | Freq: Once | ORAL | Status: AC
  Administered 2024-01-26: 650 mg via ORAL
  Filled 2024-01-26: qty 2

## 2024-01-26 MED ORDER — SODIUM CHLORIDE 0.9 % IV SOLN
600.0000 mg | Freq: Once | INTRAVENOUS | Status: AC
  Administered 2024-01-26: 600 mg via INTRAVENOUS
  Filled 2024-01-26: qty 20

## 2024-01-26 MED ORDER — DIPHENHYDRAMINE HCL 25 MG PO CAPS
50.0000 mg | ORAL_CAPSULE | Freq: Once | ORAL | Status: AC
  Administered 2024-01-26: 50 mg via ORAL
  Filled 2024-01-26: qty 2

## 2024-01-26 MED ORDER — METHYLPREDNISOLONE SODIUM SUCC 125 MG IJ SOLR
125.0000 mg | Freq: Once | INTRAMUSCULAR | Status: AC
  Administered 2024-01-26: 125 mg via INTRAVENOUS
  Filled 2024-01-26: qty 2

## 2024-01-26 NOTE — Progress Notes (Signed)
 Diagnosis: Multiple Sclerosis  Provider:  Lonna Coder MD  Procedure: IV Infusion  IV Type: Peripheral, IV Location: L Antecubital  Ocrevus  (Ocrelizumab ), Dose: 600 mg  Infusion Start Time: 1114  Infusion Stop Time: 1513  Post Infusion IV Care: Observation period completed and Peripheral IV Discontinued  Discharge: Condition: Good, Destination: Home . AVS Declined  Performed by:  Merdis Snodgrass, RN

## 2024-05-01 ENCOUNTER — Other Ambulatory Visit

## 2024-05-11 ENCOUNTER — Telehealth: Payer: Self-pay | Admitting: Neurology

## 2024-05-11 NOTE — Telephone Encounter (Signed)
 Pt is cncln the MRI due to large cost, please contact back for next steps

## 2024-05-13 ENCOUNTER — Other Ambulatory Visit

## 2024-05-16 ENCOUNTER — Other Ambulatory Visit

## 2024-05-16 NOTE — Telephone Encounter (Signed)
 Patient advised of Dr.Jaffe note,    Can she have at least and brain and cervical spine? Part of MS management is MRI surveillance but if she is unable to afford it right now, that is understandable. There are no other recommendations at this time.

## 2024-05-19 ENCOUNTER — Telehealth: Payer: Self-pay | Admitting: Neurology

## 2024-05-19 NOTE — Telephone Encounter (Signed)
 Elizabeth Tran called in wanting her records sent over to lawyer.   FYI: She was informed that she would need to have a release form sent  over.

## 2024-05-23 ENCOUNTER — Ambulatory Visit: Admitting: Neurology

## 2024-06-01 ENCOUNTER — Ambulatory Visit: Admitting: Neurology

## 2024-07-26 ENCOUNTER — Ambulatory Visit

## 2024-12-11 ENCOUNTER — Ambulatory Visit: Admitting: Neurology
